# Patient Record
Sex: Male | Born: 1970 | Race: Black or African American | Hispanic: No | Marital: Single | Smoking: Never smoker
Health system: Southern US, Community
[De-identification: ages and names within clinical notes are randomized; demographics above are authoritative.]

## PROBLEM LIST (undated history)

## (undated) DIAGNOSIS — I10 Essential (primary) hypertension: Secondary | ICD-10-CM

## (undated) DIAGNOSIS — I509 Heart failure, unspecified: Secondary | ICD-10-CM

## (undated) DIAGNOSIS — I4892 Unspecified atrial flutter: Secondary | ICD-10-CM

## (undated) DIAGNOSIS — E669 Obesity, unspecified: Secondary | ICD-10-CM

## (undated) DIAGNOSIS — I351 Nonrheumatic aortic (valve) insufficiency: Secondary | ICD-10-CM

## (undated) HISTORY — DX: Obesity, unspecified: E66.9

## (undated) HISTORY — DX: Essential (primary) hypertension: I10

## (undated) HISTORY — DX: Unspecified atrial flutter: I48.92

## (undated) HISTORY — PX: NO PAST SURGERIES: SHX2092

## (undated) HISTORY — PX: OTHER SURGICAL HISTORY: SHX169

## (undated) HISTORY — DX: Nonrheumatic aortic (valve) insufficiency: I35.1

---

## 2012-08-14 ENCOUNTER — Encounter: Payer: Self-pay | Admitting: Cardiology

## 2012-08-14 DIAGNOSIS — I509 Heart failure, unspecified: Secondary | ICD-10-CM

## 2012-10-24 ENCOUNTER — Encounter: Payer: Self-pay | Admitting: Cardiology

## 2012-10-27 ENCOUNTER — Other Ambulatory Visit: Payer: Self-pay | Admitting: *Deleted

## 2012-10-27 ENCOUNTER — Encounter: Payer: Self-pay | Admitting: Cardiology

## 2012-10-27 ENCOUNTER — Ambulatory Visit (INDEPENDENT_AMBULATORY_CARE_PROVIDER_SITE_OTHER): Payer: BC Managed Care – HMO | Admitting: Cardiology

## 2012-10-27 ENCOUNTER — Encounter: Payer: Self-pay | Admitting: *Deleted

## 2012-10-27 VITALS — BP 154/89 | HR 91 | Ht 75.0 in | Wt 237.8 lb

## 2012-10-27 DIAGNOSIS — I7121 Aneurysm of the ascending aorta, without rupture: Secondary | ICD-10-CM

## 2012-10-27 DIAGNOSIS — I10 Essential (primary) hypertension: Secondary | ICD-10-CM

## 2012-10-27 DIAGNOSIS — Z136 Encounter for screening for cardiovascular disorders: Secondary | ICD-10-CM

## 2012-10-27 DIAGNOSIS — I359 Nonrheumatic aortic valve disorder, unspecified: Secondary | ICD-10-CM

## 2012-10-27 DIAGNOSIS — I712 Thoracic aortic aneurysm, without rupture, unspecified: Secondary | ICD-10-CM

## 2012-10-27 DIAGNOSIS — I4892 Unspecified atrial flutter: Secondary | ICD-10-CM

## 2012-10-27 DIAGNOSIS — I351 Nonrheumatic aortic (valve) insufficiency: Secondary | ICD-10-CM

## 2012-10-27 NOTE — Patient Instructions (Addendum)
Your physician recommends that you schedule a follow-up appointment in: 1 month. Your physician recommends that you continue on your current medications as directed. Please refer to the Current Medication list given to you today. Your physician has requested that you have an echocardiogram. Echocardiography is a painless test that uses sound waves to create images of your heart. It provides your doctor with information about the size and shape of your heart and how well your heart's chambers and valves are working. This procedure takes approximately one hour. There are no restrictions for this procedure. Non-Cardiac CT Angiography (CTA), is a special type of CT scan that uses a computer to produce multi-dimensional views of major blood vessels throughout the body. In CT angiography, a contrast material is injected through an IV to help visualize the blood vessels

## 2012-10-27 NOTE — Progress Notes (Signed)
HPI The patient presents for evaluation of lower extremity edema and an abnormal echocardiogram. He has no prior cardiac history. However, because of lower extremity swelling recently he was sent for an echocardiogram. There was no color Doppler with this study and it was ordered as a limited study. This demonstrated an EF of 50-55%. Left atrium was moderately dilated. The aortic valve was trileaflet. However, there was moderate dilatation of the aortic root. There was mild concentric left ventricular hypertrophy. The EF was low normal at 50-55%. There was some regional wall motion abnormality with the basal inferior wall being hypokinetic. 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  The patient reports that he's had swelling in his legs for several months. He has been close to 300 pounds in the past and lost a significant amount of weight through diet but then plateaued and started gaining. He started having lower extremity swelling and was started on diuretics and lost about 15 pounds. He's continued to lose weight that he thinks is out of proportion to any diet or fluid loss. He's had increasing dyspnea with exertion that is slightly better with diuretics. He does describe PND and orthopnea. He's not having any chest pressure, neck or arm discomfort. He's had no fevers or chills. He denies any prior cardiac workup.  No Known Allergies  Current Outpatient Prescriptions  Medication Sig Dispense Refill  . furosemide (LASIX) 20 MG tablet Take 1 tablet by mouth daily.      . GuaiFENesin (MUCUS RELIEF ADULT PO) Take 1 tablet by mouth daily.      Marland Kitchen losartan-hydrochlorothiazide (HYZAAR) 100-12.5 MG per tablet Take 1 tablet by mouth daily.      . potassium chloride SA (K-DUR,KLOR-CON) 20 MEQ tablet Take 1 tablet by mouth daily.       No current facility-administered medications for this visit.    Past Medical History  Diagnosis Date  . Hypertension     No past surgical history on  file.  Family History  Problem Relation Age of Onset  . Heart attack Father 20  . Diabetes Mother     History   Social History  . Marital Status: Single    Spouse Name: N/A    Number of Children: N/A  . Years of Education: N/A   Occupational History  . Not on file.   Social History Main Topics  . Smoking status: Never Smoker   . Smokeless tobacco: Not on file  . Alcohol Use: Yes     Comment: once a month  . Drug Use: Yes     Comment: Occassional marijuana  . Sexually Active: Not on file   Other Topics Concern  . Not on file   Social History Narrative  . No narrative on file    ROS:  Positive for weight loss. Otherwise as stated in the history of present illness and negative for all other systems.  PHYSICAL EXAM BP 154/89  Pulse 91  Ht 6\' 3"  (1.905 m)  Wt 237 lb 12.8 oz (107.865 kg)  BMI 29.72 kg/m2 GENERAL:  Well appearing HEENT:  Pupils equal round and reactive, fundi not visualized, oral mucosa unremarkable NECK:  No jugular venous distention, waveform within normal limits, carotid upstroke brisk and symmetric, no bruits, no thyromegaly LYMPHATICS:  No cervical, inguinal adenopathy LUNGS:  Clear to auscultation bilaterally BACK:  No CVA tenderness CHEST:  Unremarkable HEART:  PMI not displaced or sustained,S1 and S2 within normal limits, no S3, no S4, no clicks, no rubs, Brief systolic  murmur heard at the apex and radiating slightly out the aortic outflow tract, there is a 3/6 diastolic murmur ending no later than mid diastole heard best at the left fourth intercostal space ABD:  Flat, positive bowel sounds normal in frequency in pitch, no bruits, no rebound, no guarding, no midline pulsatile mass, no hepatomegaly, no splenomegaly EXT:  2 (bounding) plus pulses throughout, mild bilateral lower extremity edema, no cyanosis no clubbing SKIN:  No rashes no nodules NEURO:  Cranial nerves II through XII grossly intact, motor grossly intact throughout PSYCH:   Cognitively intact, oriented to person place and time   EKG:  Atrial flutter, 31 conduction, left axis deviation, left anterior fascicular block.  10/27/2012  ASSESSMENT AND PLAN  AI:  The patient has aortic insufficiency by physical exam. By echo there is evidence of aortic root dilatation. I suspect this is all significant. I will start with a complete echocardiogram. I will also order CT angiogram to look at the aortic root size. For now he will continue on the medicines as listed.  AF:  The patient has a new diagnosis of atrial flutter. He does not feel tachypalpitations. I will check the above studies and then discuss with him anticoagulation therapy if no further invasive evaluation is planned. Ultimately I might also consider a Holter to make sure that this is persistent with reasonable rate control. However, I do think he is in persistent flutter. I will check a TSH. I will check other routine labs.  HTN:   For now he will continue the meds as listed but he will keep a blood pressure diary. I will consider neck titration such as the addition of calcium channel blocker given his aortic insufficiency.  We will discuss further med titration in the future.

## 2012-10-28 DIAGNOSIS — I4892 Unspecified atrial flutter: Secondary | ICD-10-CM | POA: Insufficient documentation

## 2012-10-28 DIAGNOSIS — I351 Nonrheumatic aortic (valve) insufficiency: Secondary | ICD-10-CM | POA: Insufficient documentation

## 2012-11-03 ENCOUNTER — Inpatient Hospital Stay
Admission: RE | Admit: 2012-11-03 | Discharge: 2012-11-03 | Disposition: A | Discharge status: Home / Self Care | Payer: BC Managed Care – HMO | Source: Ambulatory Visit | Attending: Cardiology | Admitting: Cardiology

## 2012-11-03 ENCOUNTER — Ambulatory Visit (INDEPENDENT_AMBULATORY_CARE_PROVIDER_SITE_OTHER)
Admission: RE | Admit: 2012-11-03 | Discharge: 2012-11-03 | Disposition: A | Discharge status: Home / Self Care | Payer: BC Managed Care – HMO | Source: Ambulatory Visit | Attending: Cardiology | Admitting: Cardiology

## 2012-11-03 DIAGNOSIS — I7121 Aneurysm of the ascending aorta, without rupture: Secondary | ICD-10-CM

## 2012-11-03 DIAGNOSIS — I712 Thoracic aortic aneurysm, without rupture, unspecified: Secondary | ICD-10-CM

## 2012-11-03 DIAGNOSIS — Z136 Encounter for screening for cardiovascular disorders: Secondary | ICD-10-CM

## 2012-11-03 MED ORDER — IOHEXOL 350 MG/ML SOLN
100.0000 mL | Freq: Once | INTRAVENOUS | Status: AC | PRN
  Administered 2012-11-03: 100 mL via INTRAVENOUS

## 2012-11-04 ENCOUNTER — Telehealth: Payer: Self-pay | Admitting: Cardiology

## 2012-11-04 NOTE — Telephone Encounter (Signed)
Informed pt of normal results

## 2012-11-04 NOTE — Telephone Encounter (Signed)
Message copied by Burnice Logan on Tue Nov 04, 2012  3:46 PM ------      Message from: FLEMING, PAMELA J      Created: Mon Nov 03, 2012  2:47 PM                   ----- Message -----         From: Rollene Rotunda, MD         Sent: 11/03/2012   1:27 PM           To: Rocco Serene, RN            Labs OK.  Call Mr. Woodberry with the results and send results to Ardyth Man, MD       ------

## 2012-11-05 ENCOUNTER — Telehealth: Payer: Self-pay | Admitting: *Deleted

## 2012-11-05 ENCOUNTER — Other Ambulatory Visit: Payer: BC Managed Care – HMO

## 2012-11-05 DIAGNOSIS — I351 Nonrheumatic aortic (valve) insufficiency: Secondary | ICD-10-CM

## 2012-11-05 DIAGNOSIS — I4892 Unspecified atrial flutter: Secondary | ICD-10-CM

## 2012-11-05 DIAGNOSIS — I779 Disorder of arteries and arterioles, unspecified: Secondary | ICD-10-CM

## 2012-11-05 MED ORDER — METOPROLOL TARTRATE 25 MG PO TABS: 25.0000 mg | ORAL_TABLET | Freq: Four times a day (QID) | ORAL | Status: DC

## 2012-11-05 NOTE — Telephone Encounter (Signed)
Patient informed. Awaiting call back from [facility].

## 2012-11-05 NOTE — Telephone Encounter (Signed)
Called to check on status of appointment, per Arline Asp, they are working on this appointment now.

## 2012-11-05 NOTE — Telephone Encounter (Signed)
Message copied by Eustace Moore on Wed Nov 05, 2012  9:25 AM ------      Message from: Rollene Rotunda      Created: Wed Nov 05, 2012  8:18 AM       I spoke with Dr. Evelene Croon.  The patient will need surgery.  We will schedule him to see Dr. Laneta Simmers ASAP.  I will start a beta blocker metoprolol 25 mg qid for now and reschedule the echocardiogram and also order a CT coronary angiogram. ------

## 2012-11-06 ENCOUNTER — Encounter: Payer: Self-pay | Admitting: *Deleted

## 2012-11-12 ENCOUNTER — Ambulatory Visit (HOSPITAL_COMMUNITY): Admission: RE | Admit: 2012-11-12 | Payer: BC Managed Care – HMO | Source: Ambulatory Visit

## 2012-11-12 ENCOUNTER — Telehealth (HOSPITAL_COMMUNITY): Payer: Self-pay | Admitting: Radiology

## 2012-11-12 ENCOUNTER — Ambulatory Visit (HOSPITAL_COMMUNITY): Payer: BC Managed Care – HMO | Attending: Cardiology | Admitting: Radiology

## 2012-11-12 ENCOUNTER — Other Ambulatory Visit: Payer: Self-pay

## 2012-11-12 ENCOUNTER — Encounter: Payer: Self-pay | Admitting: Surgery

## 2012-11-12 ENCOUNTER — Institutional Professional Consult (permissible substitution) (INDEPENDENT_AMBULATORY_CARE_PROVIDER_SITE_OTHER): Payer: BC Managed Care – HMO | Admitting: Surgery

## 2012-11-12 VITALS — BP 146/80 | HR 88 | Resp 20 | Ht 75.0 in | Wt 237.0 lb

## 2012-11-12 DIAGNOSIS — I7100 Dissection of unspecified site of aorta: Secondary | ICD-10-CM

## 2012-11-12 DIAGNOSIS — I4892 Unspecified atrial flutter: Secondary | ICD-10-CM

## 2012-11-12 DIAGNOSIS — I712 Thoracic aortic aneurysm, without rupture, unspecified: Secondary | ICD-10-CM

## 2012-11-12 DIAGNOSIS — I7121 Aneurysm of the ascending aorta, without rupture: Secondary | ICD-10-CM

## 2012-11-12 DIAGNOSIS — I359 Nonrheumatic aortic valve disorder, unspecified: Secondary | ICD-10-CM

## 2012-11-12 DIAGNOSIS — Z136 Encounter for screening for cardiovascular disorders: Secondary | ICD-10-CM

## 2012-11-12 DIAGNOSIS — I351 Nonrheumatic aortic (valve) insufficiency: Secondary | ICD-10-CM

## 2012-11-12 DIAGNOSIS — I059 Rheumatic mitral valve disease, unspecified: Secondary | ICD-10-CM | POA: Insufficient documentation

## 2012-11-12 NOTE — Progress Notes (Signed)
Echocardiogram performed.  

## 2012-11-12 NOTE — Telephone Encounter (Signed)
Spoke with Dr. Shirlee Latch.  He stated to call Dr. Laneta Simmers inform him are correlating CT.

## 2012-11-12 NOTE — Telephone Encounter (Signed)
Spoke with Katina Dung RN for Dr. Shirlee Latch, reader of the day, to take a look at the echocardiogram.  I spoke with Dennis Bast RN for Dr. Ladona Ridgel, DOD. Noted in the chart the patient seeing Dr. Laneta Simmers today 930 am and having a CTA, today.  Was told to just to sent patient to Dr. Marton Redwood.  I gave to the office it doesn't 9 am.  Answering service call Dr. Tyrone Sage, Dr on call.  He gave me the pager number for Dr. Laneta Simmers but office will open in a few minutes. I will call back.

## 2012-11-13 ENCOUNTER — Encounter: Payer: Self-pay | Admitting: Surgery

## 2012-11-13 DIAGNOSIS — I7100 Dissection of unspecified site of aorta: Secondary | ICD-10-CM | POA: Insufficient documentation

## 2012-11-13 NOTE — Progress Notes (Signed)
[address]       [place] [address]             [phone]        PCP is Ardyth Man, MD Referring Provider is Rollene Rotunda, MD  Chief Complaint  Patient presents with  . Aortic Insuffiency    surgical eval, ECHO 11/12/12, CTA Chest 11/03/12     HPI:  The patient is a 42 year old gentleman who recently presented with new onset of lower extremity swelling and increasing dyspnea with exertion. He was noted to have a diastolic murmur and an echocardiogram showed an EF of 50-55% with mild LVH and dilation of the aortic root. He was started on diuretics and lost about 15 lbs of fluid with improvement of his symptoms. A complete echo on 11/12/2012 showed moderate to severe AI with a severely dilated aortic root with a dissection plane seen above the right coronary cusp. There was moderate MR. CTA shows fusiform aneurysmal dilatation of the aortic root with a diameter of 6.3 x 7.3 cm. There is an irregular outpouching with septation along the proximal ascending aortic wall laterally that appears to be a chronic dissection. There is also diffuse mediastinal adenopathy concerning for a lymphoproliferative process.  Past Medical History  Diagnosis Date  . Hypertension   . Obesity   . Atrial flutter   . Aortic insufficiency     Past Surgical History  Procedure Laterality Date  . None      Family History  Problem Relation Age of Onset  . Heart attack Father 23  . Diabetes Mother   . Cancer Mother   . Hypertension Father   . Cancer Sister   no family hx of connective tissue disorder.   Social History History  Substance Use Topics  . Smoking status: Never Smoker   . Smokeless tobacco: Not on file  . Alcohol Use: Yes     Comment: once a month    Current Outpatient Prescriptions  Medication Sig Dispense Refill  . furosemide (LASIX) 20 MG tablet Take 1 tablet by mouth daily.      . GuaiFENesin (MUCUS RELIEF ADULT PO) Take 1 tablet by mouth daily.       Marland Kitchen losartan-hydrochlorothiazide (HYZAAR) 100-12.5 MG per tablet Take 1 tablet by mouth daily.      . metoprolol tartrate (LOPRESSOR) 25 MG tablet Take 1 tablet (25 mg total) by mouth 4 (four) times daily.  180 tablet  3  . potassium chloride SA (K-DUR,KLOR-CON) 20 MEQ tablet Take 1 tablet by mouth daily.       No current facility-administered medications for this visit.    No Known Allergies  Review of Systems  Constitutional: Positive for fatigue and unexpected weight change. Negative for fever, chills, diaphoresis, activity change and appetite change.  HENT: Negative.   Eyes: Negative.   Respiratory: Positive for cough and shortness of breath. Negative for chest tightness.        Orthopnea  Cardiovascular: Positive for leg swelling. Negative for chest pain and palpitations.  Gastrointestinal: Negative.   Endocrine: Negative.   Genitourinary: Negative.   Musculoskeletal: Negative.   Allergic/Immunologic: Negative.   Neurological: Negative.   Hematological: Negative.   Psychiatric/Behavioral: Negative.     BP 146/80  Pulse 88  Resp 20  Ht 6\' 3"  (1.905 m)  Wt 237 lb (107.502 kg)  BMI 29.62 kg/m2  SpO2 93% Physical Exam  Constitutional: He is oriented to person, place,  and time. He appears well-nourished. No distress.  HENT:  Head: Normocephalic and atraumatic.  Mouth/Throat: Oropharynx is clear and moist.  Eyes: EOM are normal. Pupils are equal, round, and reactive to light.  Neck: Normal range of motion. Neck supple. No JVD present. No thyromegaly present.  Cardiovascular: Normal rate and regular rhythm.   Murmur heard. 3/6 diastolic murmur over the aorta.  Pulmonary/Chest: Effort normal and breath sounds normal. No respiratory distress. He has no rales.  Abdominal: Soft. He exhibits no distension and no mass. There is no tenderness.  Musculoskeletal: Normal range of motion. He exhibits edema.  mild  Lymphadenopathy:    He has no cervical adenopathy.  Neurological:  He is alert and oriented to person, place, and time. No cranial nerve deficit or sensory deficit.  Skin: Skin is warm and dry.  Psychiatric: He has a normal mood and affect.     Diagnostic Tests:  [facility]                    [address]                     [place], [place] [address]                         [phone]   ------------------------------------------------------------ Transthoracic Echocardiography  Patient:    Thorin, Starner MR #:       [id number] Study Date: 11/12/2012 Gender:     M Age:        41 Height:     190.5cm Weight:     107.5kg BSA:        2.32m^2 Pt. Status: Room:    ORDERING     Hochrein, Fayrene Fearing  REFERRING    Hochrein, Fayrene Fearing  ATTENDING    Shirlee Latch, Dalton  PERFORMING   [facility]  SONOGRAPHER  Junious Dresser, RDCS cc:  ------------------------------------------------------------ LV EF: 50% -   55%  ------------------------------------------------------------ Indications:     Ascending aortic aneurysm 441.2  ------------------------------------------------------------ History:   PMH:  Abnormal CT, Aortic root 6.3 cm x, irregular outpouching with septation. Questionable suspicion for a focal chronic dissection or irregular septated aneurysmal outpouching of sinus of Valsalva. Ascending aortic aneurysm. Abnormal echo. Acquired from the patient and from the patient's chart.  Dyspnea and bilateral lower extremity edema.  Atrial flutter.  Aortic regurgitation. Risk factors:  Hypertension.  ------------------------------------------------------------ Study Conclusions  - Left ventricle: Inferobasal and distal septal hypokinesis   The cavity size was normal. Wall thickness was increased   in a pattern of mild LVH. Systolic function was normal.   The estimated ejection fraction was in the range of 50% to   55%. - Aortic valve: Moderate to severe regurgitation. - Aorta: Ascending aorta is severely dilated with disection    plane seen above right coronary cusp - Mitral valve: Moderate regurgitation. - Left atrium: The atrium was mildly dilated. - Atrial septum: No defect or patent foramen ovale was   identified. - Pulmonary arteries: PA peak pressure: 57mm Hg (S). Transthoracic echocardiography.  M-mode, complete 2D, spectral Doppler, and color Doppler.  Height:  Height: 190.5cm. Height: 75in.  Weight:  Weight: 107.5kg. Weight: 236.5lb.  Body mass index:  BMI: 29.6kg/m^2.  Body surface area:    BSA: 2.66m^2.  Blood pressure:     154/89.  Patient status:  Outpatient.  Location:  [facility]  ------------------------------------------------------------  ------------------------------------------------------------ Left ventricle:  Inferobasal and distal septal hypokinesis The cavity size was normal. Wall thickness was increased in a pattern of mild LVH. Systolic function was normal. The estimated ejection fraction was in the range of 50% to 55%.   ------------------------------------------------------------ Aortic valve:   Doppler:   Moderate to severe regurgitation.    VTI ratio of LVOT to aortic valve: 0.66. Valve area: 4.07cm^2(VTI). Indexed valve area: 1.72cm^2/m^2 (VTI). Peak velocity ratio of LVOT to aortic valve: 0.82. Valve area: 5.03cm^2 (Vmax). Indexed valve area: 2.13cm^2/m^2 (Vmax). Mean gradient: 6mm Hg (S).  ------------------------------------------------------------ Aorta:  Ascending aorta is severely dilated with disection plane seen above right coronary cusp  ------------------------------------------------------------ Mitral valve:   Doppler:   Moderate regurgitation.  ------------------------------------------------------------ Left atrium:  The atrium was mildly dilated.  ------------------------------------------------------------ Atrial septum:  No defect or patent foramen ovale was identified.  ------------------------------------------------------------ Right  ventricle:  The cavity size was normal. Wall thickness was normal. Systolic function was normal.  ------------------------------------------------------------ Pulmonic valve:    Doppler:   Mild regurgitation.  ------------------------------------------------------------ Tricuspid valve:   Doppler:   Mild regurgitation.  ------------------------------------------------------------ Right atrium:  The atrium was normal in size.  ------------------------------------------------------------ Pericardium:  The pericardium was normal in appearance.   ------------------------------------------------------------ Post procedure conclusions Ascending Aorta:  - Ascending aorta is severely dilated with disection plane   seen above right coronary cusp  ------------------------------------------------------------  2D measurements        Normal  Doppler measurements    Norma Left ventricle                                         l LVID ED,     59 mm     43-52   Main pulmonary artery chord,                         Pressure   57 mm Hg     =30 PLAX                           , S LVID ES,   42.8 mm     23-38   Left ventricle chord,                         Ea, lat  13.2 cm/s      ----- PLAX                           ann, FS, chord,   27 %      >29     tiss DP PLAX                           Ea, med   4.5 cm/s      ----- LVPW, ED   10.2 mm     ------  ann, IVS/LVPW   1.45        <1.3    tiss DP ratio, ED                      LVOT Vol ED,     189 ml     ------  Peak      120 cm/s      ----- MOD1  vel, S Vol ES,     104 ml     ------  VTI, S   18.9 cm        ----- MOD1                           Peak        6 mm Hg     ----- EF, MOD1     45 %      ------  gradient Vol index,   80 ml/m^2 ------  , S ED, MOD1                       HR         89 bpm       ----- Vol index,   44 ml/m^2 ------  Stroke   116. ml        ----- ES, MOD1                       vol         4 Vol ED,      196 ml     ------  Cardiac  10.4 L/min     ----- MOD2                           output Vol ES,     105 ml     ------  Cardiac   4.4 L/(min-m^ ----- MOD2                           index         2) EF, MOD2     46 %      ------  Stroke   49.3 ml/m^2    ----- Stroke       91 ml     ------  index vol, MOD2                      Aortic valve Vol index,   83 ml/m^2 ------  Peak      147 cm/s      ----- ED, MOD2                       vel, S Vol index,   44 ml/m^2 ------  Mean      113 cm/s      ----- ES, MOD2                       vel, S Stroke     38.6 ml/m^2 ------  VTI, S   28.6 cm        ----- index,                         Mean        6 mm Hg     ----- MOD2                           gradient Ventricular septum             , S IVS, ED    14.8 mm     ------  VTI      0.66           -----  LVOT                           ratio Diam, S      28 mm     ------  LVOT/AV Area       6.16 cm^2   ------  Area,    4.07 cm^2      ----- Diam         28 mm     ------  VTI Aorta                          Area     1.72 cm^2/m^2  ----- Root diam,   41 mm     ------  index ED                             (VTI) AAo AP       65 mm     ------  Peak vel 0.82           ----- diam, S                        ratio, Left atrium                    LVOT/AV AP dim       52 mm     ------  Area,    5.03 cm^2      ----- AP dim      2.2 cm/m^2 <2.2    Vmax index                          Area     2.13 cm^2/m^2  -----                                index                                (Vmax)                                Regurg    296 ms        -----                                PHT                                Mitral valve                                Max       530 cm/s      -----                                regurg  vel                                Regurg    156 cm        -----                                VTI                                Tricuspid valve                                 Regurg    359 cm/s      -----                                peak vel                                Peak       52 mm Hg     -----                                RV-RA                                gradient                                , S                                Systemic veins                                Estimate    5 mm Hg     -----                                d CVP                                Right ventricle                                Pressure   57 mm Hg     <30                                , S                                Sa vel,  10.7 cm/s      -----  lat ann,                                tiss DP   ------------------------------------------------------------ Prepared and Electronically Authenticated by  Charlton Haws 2014-07-16T09:[id number]     *RADIOLOGY REPORT*   Clinical Data: Ascending aortic aneurysm, aortic insufficiency, atrial flutter   CT ANGIOGRAPHY CHEST   Technique:  Multidetector CT imaging of the chest using the standard protocol during bolus administration of intravenous contrast. Multiplanar reconstructed images including MIPs were obtained and reviewed to evaluate the vascular anatomy.   Contrast: OMNIPAQUE IOHEXOL 350 MG/ML SOLN   Comparison: None.   Findings: There is fusiform aneurysmal dilatation noted of the proximal ascending aortic root into the sinus of Valsalva.  Aortic root diameter measures 6.3 cm AP and 7.3 cm transverse, image 54. Along the proximal ascending aortic wall laterally, there is an irregular outpouching with septation, images 39-48.  This is suspicious for a focal chronic dissection or irregular septated aneurysmal outpouching related to the dilated sinus of Valsalva. No intramural hemorrhage or mediastinal hemorrhage appreciated.  No pericardial effusion.  Heart is enlarged diffusely.  Bovine arch anatomy is noted, a normal variant.  Major branch  vessels remain patent.  Descending thoracic aorta is normal in caliber.   Diffuse mediastinal adenopathy noted.  Superior right paratracheal lymph node measures 3.3 x 2.7 cm, image 21.  Additional enlarged prevascular, AP window, and precarinal lymph nodes are noted. These lymph nodes are suspicious for a lymphoproliferative process.   Lung windows demonstrate diffuse patchy ground-glass opacities throughout the lungs with interlobular septal thickening bilaterally suspicious for chronic interstitial edema.  There is also a trace right pleural effusion.  Suspect mild congestive heart failure.   Included upper abdomen demonstrates an incidental hypodense probable cyst in the right hepatic lobe image 116 measuring 10 mm. Thickening of the left adrenal gland is noted, nonspecific.  No acute finding in the upper abdomen demonstrated.   Mild diffuse thoracic degenerative change with prominent lower thoracic Schmorl's nodes.  No compression fracture.   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   Irregular septated outpouching of the proximal ascending thoracic aorta along the right lateral wall which could represent a chronic focal dissection versus chronic irregular distention and dilatation of the sinus of Valsalva.   No acute intramural hemorrhage, mediastinal hemorrhage, or pericardial effusion.   Diffuse mediastinal adenopathy, concerning for a lymphoproliferative process such as lymphoma.   Cardiomegaly with diffuse interstitial edema and small right effusion compatible with mild CHF     Original Report Authenticated By: Judie Petit. Miles Costain, M.D.     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 There is moderate MR. I agree that surgical repair is indicated due to the high risk of further dissection or rupture as well as  progressive congestive heart failure due to severe AI. He will need to have his coronaries evaluated. He has a strong family history of heart disease. His MR can be evaluated in the OR with TEE.I think cath would be difficult with his large root aneurysm. Cardiac CT would probably give [place] the information we need. He does appear to have some calcified plaque in the proximal LAD on his chest CTA. At 42 years old I have recommended a mechanical valve if his valve  is not suitable for repair. I discussed the need for lifelong anticoagulation with coumadin. I discussed the operative procedure with the patient and family including alternatives, benefits and risks; including but not limited to bleeding, blood transfusion, infection, stroke, myocardial infarction, graft failure, heart block requiring a permanent pacemaker, organ dysfunction, and death.  Eden Lathe understands and would like to think about this further before scheduling surgery.    Plan:  He said that he would call us when he decides to proceed with surgery. I discussed the importance of proceeding with surgery soon since this is a large aneurysm. He understands the ongoing risk of dissection, rupture and death if this is not repaired promptly. He will need a cardiac CT done to evaluate his coronary arteries.

## 2012-11-14 ENCOUNTER — Ambulatory Visit (HOSPITAL_COMMUNITY)
Admission: RE | Admit: 2012-11-14 | Discharge: 2012-11-14 | Disposition: A | Discharge status: Home / Self Care | Payer: BC Managed Care – HMO | Source: Ambulatory Visit | Attending: Cardiology | Admitting: Cardiology

## 2012-11-14 ENCOUNTER — Encounter (HOSPITAL_COMMUNITY): Payer: Self-pay

## 2012-11-14 ENCOUNTER — Telehealth: Payer: Self-pay | Admitting: *Deleted

## 2012-11-14 DIAGNOSIS — I779 Disorder of arteries and arterioles, unspecified: Secondary | ICD-10-CM

## 2012-11-14 DIAGNOSIS — I359 Nonrheumatic aortic valve disorder, unspecified: Secondary | ICD-10-CM | POA: Insufficient documentation

## 2012-11-14 DIAGNOSIS — R599 Enlarged lymph nodes, unspecified: Secondary | ICD-10-CM | POA: Insufficient documentation

## 2012-11-14 DIAGNOSIS — J8410 Pulmonary fibrosis, unspecified: Secondary | ICD-10-CM | POA: Insufficient documentation

## 2012-11-14 DIAGNOSIS — I4892 Unspecified atrial flutter: Secondary | ICD-10-CM | POA: Insufficient documentation

## 2012-11-14 DIAGNOSIS — Z01818 Encounter for other preprocedural examination: Secondary | ICD-10-CM | POA: Insufficient documentation

## 2012-11-14 DIAGNOSIS — I351 Nonrheumatic aortic (valve) insufficiency: Secondary | ICD-10-CM

## 2012-11-14 HISTORY — DX: Heart failure, unspecified: I50.9

## 2012-11-14 MED ORDER — METOPROLOL TARTRATE 1 MG/ML IV SOLN
INTRAVENOUS | Status: AC
  Administered 2012-11-14: 5 mg
  Filled 2012-11-14: qty 5

## 2012-11-14 MED ORDER — NITROGLYCERIN 0.4 MG SL SUBL
SUBLINGUAL_TABLET | SUBLINGUAL | Status: AC
  Administered 2012-11-14: 16:00:00
  Filled 2012-11-14: qty 25

## 2012-11-14 MED ORDER — METOPROLOL TARTRATE 1 MG/ML IV SOLN
5.0000 mg | INTRAVENOUS | Status: DC | PRN
  Administered 2012-11-14: 5 mg via INTRAVENOUS
  Filled 2012-11-14: qty 5

## 2012-11-14 MED ORDER — METOPROLOL TARTRATE 1 MG/ML IV SOLN
INTRAVENOUS | Status: AC
  Administered 2012-11-14: 5 mg
  Filled 2012-11-14: qty 15

## 2012-11-14 MED ORDER — NITROGLYCERIN 0.4 MG SL SUBL
0.4000 mg | SUBLINGUAL_TABLET | SUBLINGUAL | Status: DC | PRN
  Filled 2012-11-14: qty 25

## 2012-11-14 MED ORDER — IOHEXOL 350 MG/ML SOLN
80.0000 mL | Freq: Once | INTRAVENOUS | Status: AC | PRN
  Administered 2012-11-14: 80 mL via INTRAVENOUS

## 2012-11-14 NOTE — Telephone Encounter (Signed)
Patient informed and copy sent to PCP. 

## 2012-11-14 NOTE — Telephone Encounter (Signed)
Message copied by Eustace Moore on Fri Nov 14, 2012 10:46 AM ------      Message from: Rollene Rotunda      Created: Thu Nov 13, 2012 10:40 PM       The patient is aware of the results and the need for surgery.  He has been seen in consultation by Dr. Laneta Simmers.  Please refer to his note for details. Call Mr. Zwahlen with the results and send results to Ardyth Man, MD       ------

## 2012-11-14 NOTE — Telephone Encounter (Signed)
Message copied by Eustace Moore on Fri Nov 14, 2012 10:04 AM ------      Message from: Sean Juarez      Created: Thu Nov 13, 2012 10:40 PM       The patient is aware of the results and the need for surgery.  He has been seen in consultation by Dr. Laneta Simmers.  Please refer to his note for details. Call Mr. Dolata with the results and send results to Ardyth Man, MD       ------

## 2012-11-21 ENCOUNTER — Telehealth: Payer: Self-pay | Admitting: *Deleted

## 2012-11-21 NOTE — Telephone Encounter (Signed)
Message copied by Eustace Moore on Fri Nov 21, 2012 10:51 AM ------      Message from: Meredeth Ide, PAMELA J      Created: Tue Nov 18, 2012  1:23 PM                   ----- Message -----         From: Rollene Rotunda, MD         Sent: 11/15/2012   7:55 PM           To: Rocco Serene, RN            Nonobstructive CAD.  Call Mr. Cousins with the results and send results to Ardyth Man, MD       ------

## 2012-11-21 NOTE — Telephone Encounter (Signed)
Patient informed and copy sent to PCP. 

## 2012-11-25 ENCOUNTER — Other Ambulatory Visit: Payer: Self-pay

## 2012-11-25 DIAGNOSIS — I712 Thoracic aortic aneurysm, without rupture, unspecified: Secondary | ICD-10-CM

## 2012-11-25 DIAGNOSIS — I359 Nonrheumatic aortic valve disorder, unspecified: Secondary | ICD-10-CM

## 2012-12-01 ENCOUNTER — Encounter (HOSPITAL_COMMUNITY): Payer: Self-pay

## 2012-12-03 ENCOUNTER — Encounter: Payer: Self-pay | Admitting: Surgery

## 2012-12-03 ENCOUNTER — Ambulatory Visit (INDEPENDENT_AMBULATORY_CARE_PROVIDER_SITE_OTHER): Payer: BC Managed Care – HMO | Admitting: Surgery

## 2012-12-03 VITALS — BP 136/86 | HR 90 | Resp 20 | Ht 75.0 in | Wt 237.0 lb

## 2012-12-03 DIAGNOSIS — I359 Nonrheumatic aortic valve disorder, unspecified: Secondary | ICD-10-CM

## 2012-12-03 DIAGNOSIS — I712 Thoracic aortic aneurysm, without rupture, unspecified: Secondary | ICD-10-CM

## 2012-12-03 DIAGNOSIS — I351 Nonrheumatic aortic (valve) insufficiency: Secondary | ICD-10-CM

## 2012-12-03 DIAGNOSIS — I7121 Aneurysm of the ascending aorta, without rupture: Secondary | ICD-10-CM

## 2012-12-04 ENCOUNTER — Encounter: Payer: Self-pay | Admitting: Surgery

## 2012-12-04 NOTE — Progress Notes (Signed)
[address]       [place] [address]             [phone]        HPI:  The patient is a 42 year old gentleman who recently presented with new onset of lower extremity swelling and increasing dyspnea with exertion. He was noted to have a diastolic murmur and an echocardiogram showed an EF of 50-55% with mild LVH and dilation of the aortic root. He was started on diuretics and lost about 15 lbs of fluid with improvement of his symptoms. A complete echo on 11/12/2012 showed moderate to severe AI with a severely dilated aortic root with a dissection plane seen above the right coronary cusp. There was moderate MR. CTA shows fusiform aneurysmal dilatation of the aortic root with a diameter of 6.3 x 7.3 cm. There is an irregular outpouching with septation along the proximal ascending aortic wall laterally that appears to be a chronic dissection. There is also diffuse mediastinal adenopathy concerning for a lymphoproliferative process. He underwent a cardiac CT  On 11/17/2012 which showed no significant coronary disease.      Current Outpatient Prescriptions  Medication Sig Dispense Refill  . furosemide (LASIX) 20 MG tablet Take 1 tablet by mouth 2 (two) times daily.       Marland Kitchen guaiFENesin (MUCINEX) 600 MG 12 hr tablet Take 1,200 mg by mouth 2 (two) times daily as needed for congestion.      Marland Kitchen losartan-hydrochlorothiazide (HYZAAR) 100-12.5 MG per tablet Take 1 tablet by mouth daily.      . metoprolol tartrate (LOPRESSOR) 25 MG tablet Take 25 mg by mouth 2 (two) times daily.      . potassium chloride SA (K-DUR,KLOR-CON) 20 MEQ tablet Take 1 tablet by mouth daily.       No current facility-administered medications for this visit.     Physical Exam: BP 136/86  Pulse 90  Resp 20  Ht 6\' 3"  (1.905 m)  Wt 237 lb (107.502 kg)  BMI 29.62 kg/m2  SpO2 96% He looks well Cardiac exam shows regular rate and regular rhythm.  3/6 diastolic murmur over the aorta.  Pulmonary/Chest:  Effort normal and breath sounds normal. No respiratory distress. He has no rales. There is moderate bilateral lower extremity edema to the knees.  Diagnostic Tests:  Cyndie Chime, MD Mon Nov 17, 2012 2:09:24 PM EDT       **ADDENDUM** CREATED: 11/17/2012 14:03:22  OVER-READ INTERPRETATION - CT CHEST  The following report is an over-read performed by radiologist Dr.  Aubery Lapping. Kearney Hard, M.D. of [facility], [place] on 11/17/2012  14:03:22. This over-read does not include interpretation of  cardiac or coronary anatomy or pathology. The CTA interpretation  by the cardiologist is attached.  Comparison: Chest 11/03/2012  Findings: Patchy peripheral ground-glass densities are noted within  the lungs which are stable since prior study. This could represent  areas of hypoaeration/atelectasis or mild alveolitis.  Stable ascending thoracic aortic aneurysm and probable focal  dissection proximally. Stable mediastinal adenopathy and mild  cardiomegaly. No acute bony abnormality. Small right pleural  effusions.  IMPRESSION:  Findings are similar to prior study except for slight enlargement  of a small right pleural effusion. Continued cardiomegaly.  Ascending aortic aneurysm and dissection are stable. Stable  mediastinal adenopathy.  **END ADDENDUM** SIGNED BY: Aubery Lapping. Kearney Hard, M.D.      Study Result    Cardiac CT:  Indication: Aortic Disection Surgical planning  and question need  for bypass grafting  Protocol: Unfortunately the patient was in rapid atrial flutter at  presentation. He also had difficulty laying flat. He was given 15  mg of i.v. lopressor and sublingual nitro. Average flutter rate  during the scan was 70 bpm. He was scanned on a Philips 256  scanner. Given his atrial flutter we chose to scan him  retrospectively at 120 kV The 3D data set was analyzed on a Philips  work station using MIP, VRT and MPR modes  Findings:  Non-cardiac: Atelectasis and diffuse interstitial lung  disease  especially at bases Mediastinal adenopathy  Calcium Score: 102 with one foci of calcification in the proximal  LAD  Coronary Arteries: Right dominant with no anomalies.  LM- normal  LAD- Less than 30% calcified disease proximally. Normal mid and  distal vessel not well seen  D: normal  Circumflex: normal with single OM that comes off LM like an  intermediate branch with small AV groove branch  RCA- large dominant vessel that is normal  Aorta: There is a focal aortic dissection that appears to start  above the RCA and extends along the lateral aortic wall. The arch,  great vessels and descending thoracic aorta are not involvled  Diameter of combined false and true lumen 6.3 cm  Arch diameter 3.3 cm  Descneding Thoracic Aorta 2.7 cm  Impression:  Suboptimal study due to atrial flutter, filling of the false lumen  and aortic insufficiency that made filling the coronary arteries  difficult  1) Calcium scar 102 single area of calcification in proximal LAD  2) Right dominant arteries with no evidence of high grade  disease that would need bypass. Although the study is not ideal I  think it is diagnostic enough to avoid the risk of invasive cath  3) Focal ascending aortic dissection with brisk filling of the  false lumen and combined diameter of 6.3 cm No involvement of  aortic arch  4) Mediastinal adenopathy see separate report from [facility]  Radiology  Charlton Haws MD [facility]  Original Report Authenticated By: Charlton Haws, M.D.      Impression:  He will require a Bentall procedure using a mechanical valved graft. I discussed the pros and cons of mechanical and tissue valves with the patient and his family. I have recommended a mechanical valve given his young age. He understands the need for lifelong coumadin and the risks of anticoagulant related bleeding and thromboembolism which can be as high as 1-2 % per year each. He will also require biopsy of the mediastinal  adenopathy seen on CT. The etiology of this is unclear but it could be a lymphoproliferative disorder. I don't think there is any way to biopsy this adenopathy without putting him under anesthesia and I think we should fix his aneurysm and AI and then worry about the adenopathy if it requires treatment. He also has some MR by echo that appeared moderate on his last echo and this will require evaluation with TEE in the OR.  I discussed the operative procedure with the patient and family including alternatives, benefits and risks; including but not limited to bleeding, blood transfusion, infection, stroke, myocardial infarction, graft failure, heart block requiring a permanent pacemaker, organ dysfunction, and death.  Eden Lathe understands and agrees to proceed.  We will schedule surgery for 12/15/2012  Plan:  Bentall procedure with a mechanical valve graft, possible mitral valve repair, and biopsy of the mediastinal adenopathy on 12/15/2012.

## 2012-12-05 ENCOUNTER — Encounter: Payer: BC Managed Care – HMO | Admitting: Cardiovascular Disease

## 2012-12-05 ENCOUNTER — Encounter: Payer: Self-pay | Admitting: Cardiovascular Disease

## 2012-12-05 ENCOUNTER — Telehealth: Payer: Self-pay | Admitting: Cardiovascular Disease

## 2012-12-05 NOTE — Telephone Encounter (Signed)
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

## 2012-12-08 NOTE — Telephone Encounter (Signed)
I first saw him on June 30th and it would be reasonable to suggest this as the date to stop work.

## 2012-12-09 ENCOUNTER — Telehealth: Payer: Self-pay | Admitting: Cardiology

## 2012-12-09 NOTE — Telephone Encounter (Signed)
Received denial letter dated 11/13/12 for Cardiac CTA done on 11/14/12 @ [facility]. Was originally denied by [employer] due to being considered investigational. Dr. Antoine Poche spoke with [facility] medical director and got approval # [id number] which was valid 11/12/12 -11/26/12. I called [employer] and they state they have paid $785.00 of the allowed amount of total charges of $1,213.90 and payment was made on 12/03/12.  They had record of the authorization and state the denial letter was probably sent out before they had the authorization on file from their medical director. I called [facility] with this information and she Edwyna Perfect) stated that they did not have payment on file yet and that it would probably be posted in the next few days.   I will check on this account this week and make sure it is taken care of.

## 2012-12-09 NOTE — Progress Notes (Signed)
Patient ID: Sean Juarez, male   DOB: Sep 20, 1970, 42 y.o.   MRN: [id number] Office visit cancelled.

## 2012-12-10 NOTE — Telephone Encounter (Signed)
Patient informed. 

## 2012-12-11 ENCOUNTER — Encounter (HOSPITAL_COMMUNITY)
Admission: RE | Admit: 2012-12-11 | Discharge: 2012-12-11 | Disposition: A | Discharge status: Home / Self Care | Payer: BC Managed Care – PPO | Source: Ambulatory Visit | Attending: Surgery | Admitting: Surgery

## 2012-12-11 ENCOUNTER — Telehealth: Payer: Self-pay

## 2012-12-11 ENCOUNTER — Encounter (HOSPITAL_COMMUNITY): Payer: Self-pay

## 2012-12-11 ENCOUNTER — Ambulatory Visit (HOSPITAL_COMMUNITY)
Admission: RE | Admit: 2012-12-11 | Discharge: 2012-12-11 | Disposition: A | Discharge status: Home / Self Care | Payer: BC Managed Care – PPO | Source: Ambulatory Visit | Attending: Surgery | Admitting: Surgery

## 2012-12-11 VITALS — BP 130/82 | HR 81 | Temp 98.0°F | Resp 20 | Ht 75.0 in | Wt 235.0 lb

## 2012-12-11 DIAGNOSIS — I712 Thoracic aortic aneurysm, without rupture, unspecified: Secondary | ICD-10-CM

## 2012-12-11 DIAGNOSIS — Z01812 Encounter for preprocedural laboratory examination: Secondary | ICD-10-CM | POA: Insufficient documentation

## 2012-12-11 DIAGNOSIS — Z01810 Encounter for preprocedural cardiovascular examination: Secondary | ICD-10-CM | POA: Insufficient documentation

## 2012-12-11 DIAGNOSIS — R9431 Abnormal electrocardiogram [ECG] [EKG]: Secondary | ICD-10-CM | POA: Insufficient documentation

## 2012-12-11 DIAGNOSIS — I059 Rheumatic mitral valve disease, unspecified: Secondary | ICD-10-CM | POA: Insufficient documentation

## 2012-12-11 DIAGNOSIS — I359 Nonrheumatic aortic valve disorder, unspecified: Secondary | ICD-10-CM

## 2012-12-11 DIAGNOSIS — I517 Cardiomegaly: Secondary | ICD-10-CM | POA: Insufficient documentation

## 2012-12-11 DIAGNOSIS — B958 Unspecified staphylococcus as the cause of diseases classified elsewhere: Secondary | ICD-10-CM

## 2012-12-11 DIAGNOSIS — I4460 Unspecified fascicular block: Secondary | ICD-10-CM | POA: Insufficient documentation

## 2012-12-11 DIAGNOSIS — I4902 Ventricular flutter: Secondary | ICD-10-CM | POA: Insufficient documentation

## 2012-12-11 DIAGNOSIS — Z01811 Encounter for preprocedural respiratory examination: Secondary | ICD-10-CM | POA: Insufficient documentation

## 2012-12-11 LAB — PULMONARY FUNCTION TEST

## 2012-12-11 LAB — URINALYSIS, ROUTINE W REFLEX MICROSCOPIC
Glucose, UA: NEGATIVE mg/dL
Leukocytes, UA: NEGATIVE
Nitrite: NEGATIVE
pH: 7 (ref 5.0–8.0)

## 2012-12-11 LAB — COMPREHENSIVE METABOLIC PANEL
Alkaline Phosphatase: 84 U/L (ref 39–117)
BUN: 12 mg/dL (ref 6–23)
CO2: 22 mEq/L (ref 19–32)
Chloride: 102 mEq/L (ref 96–112)
GFR calc Af Amer: >90 mL/min (ref >90)
GFR calc non Af Amer: >90 mL/min (ref >90)
Glucose, Bld: 93 mg/dL (ref 70–99)
Potassium: 3.8 mEq/L (ref 3.5–5.1)
Total Bilirubin: 1.4 mg/dL — ABNORMAL HIGH (ref 0.3–1.2)

## 2012-12-11 LAB — ABO/RH: ABO/RH(D): O POS

## 2012-12-11 LAB — SURGICAL PCR SCREEN: MRSA, PCR: NEGATIVE

## 2012-12-11 LAB — BLOOD GAS, ARTERIAL
Bicarbonate: 28.4 mEq/L — ABNORMAL HIGH (ref 20.0–24.0)
Patient temperature: 98.6
TCO2: 29.8 mmol/L (ref 0–100)
pH, Arterial: 7.416 (ref 7.350–7.450)

## 2012-12-11 LAB — CBC
HCT: 35.9 % — ABNORMAL LOW (ref 39.0–52.0)
Hemoglobin: 11.5 g/dL — ABNORMAL LOW (ref 13.0–17.0)
MCHC: 32 g/dL (ref 30.0–36.0)
WBC: 7.4 10*3/uL (ref 4.0–10.5)

## 2012-12-11 LAB — HEMOGLOBIN A1C: Hgb A1c MFr Bld: 5.5 % (ref <5.7)

## 2012-12-11 MED ORDER — MUPIROCIN 2 % EX OINT: TOPICAL_OINTMENT | Freq: Two times a day (BID) | CUTANEOUS | Status: DC

## 2012-12-11 MED ORDER — ALBUTEROL SULFATE (5 MG/ML) 0.5% IN NEBU
2.5000 mg | INHALATION_SOLUTION | Freq: Once | RESPIRATORY_TRACT | Status: AC
  Administered 2012-12-11: 2.5 mg via RESPIRATORY_TRACT

## 2012-12-11 NOTE — Pre-Procedure Instructions (Signed)
Sean Juarez  12/11/2012   Your procedure is scheduled on:  Thursday, August 21  Report to [facility] at 0530 AM.  Call this number if you have problems the morning of surgery: [phone]   Remember:   Do not eat food or drink liquids after midnight.Wednesday night   Take these medicines the morning of surgery with A SIP OF WATER: Metoprolol   Do not wear jewelry, make-up or nail polish.  Do not wear lotions, powders, or perfumes. You may not wear deodorant.  Do not shave 48 hours prior to surgery. Men may shave face and neck.  Do not bring valuables to the hospital.  [facility] is not responsible  for any belongings or valuables.  Contacts, dentures or bridgework may not be worn into surgery.  Leave suitcase in the car. After surgery it may be brought to your room.  For patients admitted to the hospital, checkout time is 11:00 AM the day of  discharge.   Special Instructions: Incentive Spirometry - Practice and bring it with you on the day of surgery. Shower using CHG 2 nights before surgery and the night before surgery.  If you shower the day of surgery use CHG.  Use special wash - you have one bottle of CHG for all showers.  You should use approximately 1/3 of the bottle for each shower.   Please read over the following fact sheets that you were given: Pain Booklet, Coughing and Deep Breathing, Blood Transfusion Information, Open Heart Packet and Surgical Site Infection Prevention

## 2012-12-11 NOTE — Telephone Encounter (Signed)
RX for Mupirocin 2% ointment called to pharm with instructions of use.

## 2012-12-11 NOTE — Progress Notes (Signed)
Pre-op Cardiac Surgery  Carotid Findings:  Findings are consistent with 1-39% internal carotid artery stenosis bilaterally. Vertebral arteries are patent with antegrade flow.  Upper Extremity Right Left  Brachial Pressures 133-Triphasic 125-Triphasic  Radial Waveforms Triphasic Triphasic  Ulnar Waveforms Triphasic Triphasic  Palmar Arch (Allen's Test) Signal is unaffected with radial compression, obliterates with ulnar compression. Within normal limits.    12/11/2012 3:20 PM Gertie Fey, RVT, RDCS, RDMS

## 2012-12-12 NOTE — Progress Notes (Addendum)
Anesthesia chart review: Patient is a 42 year old male scheduled for Bentall procedure, possible mitral valve repair on 12/18/2012 (changed from 12/15/2012) by Dr. Laneta Simmers. Notes also indicate that he will undergo biopsy for mediastinal adenopathy.   History includes nonsmoker, hypertension, moderate to severe aortic regurgitation with descending aortic aneurysm with dissection, moderate mitral regurgitation, congestive heart failure, atrial flutter. PCP is Dr. Meredith Mody.  Cardiologist is Dr. Antoine Poche.  EKG on 12/11/2012 showed atrial flutter 3:1 AV conduction, 88 bpm, left anterior fascicular block, possible inferior infarct, age undetermined, T./T wave abnormality consider inferior lateral ischemia. Overall it appears stable since 10/27/2012.  Cardiac CT on 11/14/12 showed: 1) Calcium scar 102 single area of calcification in proximal LAD (< 30%) 2) Right dominant arteries with no evidence of high grade disease that would need bypass. Although the study is not ideal I (Dr. Eden Emms) think it is diagnostic enough to avoid the risk of invasive cath  3) Focal ascending aortic dissection with brisk filling of the false lumen and combined diameter of 6.3 cm No involvement of aortic arch  4) Mediastinal adenopathy   Echo on 11/12/12 showed: - Left ventricle: Inferobasal and distal septal hypokinesis. The cavity size was normal. Wall thickness was increased in a pattern of mild LVH. Systolic function was normal. The estimated ejection fraction was in the range of 50% to 55%. - Aortic valve: Moderate to severe regurgitation. - Aorta: Ascending aorta is severely dilated with disection plane seen above right coronary cusp. - Mitral valve: Moderate regurgitation. - Left atrium: The atrium was mildly dilated. - Atrial septum: No defect or patent foramen ovale was identified. - Pulmonary arteries: PA peak pressure: 57mm Hg (S).  PFT on 12/11/12: FVC 3.23 (62%), FEV1 2.32 (55%), DLCO 79%.  Preliminary carotid  duplex on 12/11/12 showed 1-39% bilateral ICA stenosis, antegrade vertebral artery flow.  Preoperative chest x-ray and labs noted.  Patient has known aflutter that remains rate controlled.  Anticipate that he can proceed as planned.  Velna Ochs [facility] Stay [facility]/Anesthesiology Phone [phone] 12/12/2012 9:44 AM

## 2012-12-17 MED ORDER — SODIUM CHLORIDE 0.9 % IV SOLN
INTRAVENOUS | Status: DC
  Filled 2012-12-17: qty 30

## 2012-12-17 MED ORDER — MAGNESIUM SULFATE 50 % IJ SOLN
40.0000 meq | INTRAMUSCULAR | Status: DC
  Filled 2012-12-17: qty 10

## 2012-12-17 MED ORDER — EPINEPHRINE HCL 1 MG/ML IJ SOLN
0.5000 ug/min | INTRAVENOUS | Status: DC
  Filled 2012-12-17: qty 4

## 2012-12-17 MED ORDER — NITROGLYCERIN IN D5W 200-5 MCG/ML-% IV SOLN
2.0000 ug/min | INTRAVENOUS | Status: DC
  Filled 2012-12-17: qty 250

## 2012-12-17 MED ORDER — POTASSIUM CHLORIDE 2 MEQ/ML IV SOLN
80.0000 meq | INTRAVENOUS | Status: DC
  Filled 2012-12-17: qty 40

## 2012-12-17 MED ORDER — SODIUM CHLORIDE 0.9 % IV SOLN
INTRAVENOUS | Status: AC
  Administered 2012-12-18: 1 [IU]/h via INTRAVENOUS
  Filled 2012-12-17: qty 1

## 2012-12-17 MED ORDER — DOPAMINE-DEXTROSE 3.2-5 MG/ML-% IV SOLN
2.0000 ug/kg/min | INTRAVENOUS | Status: AC
  Administered 2012-12-18: 3 ug/kg/min via INTRAVENOUS
  Filled 2012-12-17: qty 250

## 2012-12-17 MED ORDER — DEXTROSE 5 % IV SOLN
750.0000 mg | INTRAVENOUS | Status: DC
  Filled 2012-12-17: qty 750

## 2012-12-17 MED ORDER — DEXMEDETOMIDINE HCL IN NACL 400 MCG/100ML IV SOLN
0.1000 ug/kg/h | INTRAVENOUS | Status: AC
  Administered 2012-12-18: 0.2 ug/kg/h via INTRAVENOUS
  Filled 2012-12-17: qty 100

## 2012-12-17 MED ORDER — PLASMA-LYTE 148 IV SOLN
INTRAVENOUS | Status: DC
  Filled 2012-12-17: qty 2.5

## 2012-12-17 MED ORDER — DEXTROSE 5 % IV SOLN
1.5000 g | INTRAVENOUS | Status: AC
  Administered 2012-12-18: .75 g via INTRAVENOUS
  Administered 2012-12-18: 1.5 g via INTRAVENOUS
  Filled 2012-12-17: qty 1.5

## 2012-12-17 MED ORDER — VANCOMYCIN HCL 10 G IV SOLR
1500.0000 mg | INTRAVENOUS | Status: AC
  Administered 2012-12-18: 1500 mg via INTRAVENOUS
  Filled 2012-12-17: qty 1500

## 2012-12-17 MED ORDER — PHENYLEPHRINE HCL 10 MG/ML IJ SOLN
30.0000 ug/min | INTRAVENOUS | Status: AC
  Administered 2012-12-18: 15 ug/min via INTRAVENOUS
  Filled 2012-12-17: qty 2

## 2012-12-17 MED ORDER — SODIUM CHLORIDE 0.9 % IV SOLN
INTRAVENOUS | Status: AC
  Administered 2012-12-18: 69.8 mL/h via INTRAVENOUS
  Filled 2012-12-17: qty 40

## 2012-12-17 MED ORDER — METOPROLOL TARTRATE 12.5 MG HALF TABLET: 12.5000 mg | ORAL_TABLET | Freq: Once | ORAL | Status: DC

## 2012-12-18 ENCOUNTER — Inpatient Hospital Stay (HOSPITAL_COMMUNITY)
Admission: RE | Admit: 2012-12-18 | Discharge: 2012-12-23 | DRG: 545 | Disposition: A | Discharge status: Home / Self Care | Payer: BC Managed Care – PPO | Source: Ambulatory Visit | Attending: Surgery | Admitting: Surgery

## 2012-12-18 ENCOUNTER — Encounter (HOSPITAL_COMMUNITY): Payer: Self-pay | Admitting: *Deleted

## 2012-12-18 ENCOUNTER — Encounter (HOSPITAL_COMMUNITY)
Admission: RE | Disposition: A | Discharge status: Home / Self Care | Payer: Self-pay | Source: Ambulatory Visit | Attending: Surgery

## 2012-12-18 ENCOUNTER — Inpatient Hospital Stay (HOSPITAL_COMMUNITY): Payer: BC Managed Care – PPO

## 2012-12-18 ENCOUNTER — Inpatient Hospital Stay (HOSPITAL_COMMUNITY): Payer: BC Managed Care – PPO | Admitting: Certified Registered"

## 2012-12-18 ENCOUNTER — Encounter (HOSPITAL_COMMUNITY): Payer: Self-pay | Admitting: Vascular Surgery

## 2012-12-18 DIAGNOSIS — I71019 Dissection of thoracic aorta, unspecified: Secondary | ICD-10-CM | POA: Diagnosis present

## 2012-12-18 DIAGNOSIS — R599 Enlarged lymph nodes, unspecified: Secondary | ICD-10-CM

## 2012-12-18 DIAGNOSIS — I712 Thoracic aortic aneurysm, without rupture, unspecified: Secondary | ICD-10-CM

## 2012-12-18 DIAGNOSIS — I2789 Other specified pulmonary heart diseases: Secondary | ICD-10-CM | POA: Diagnosis present

## 2012-12-18 DIAGNOSIS — I10 Essential (primary) hypertension: Secondary | ICD-10-CM | POA: Diagnosis present

## 2012-12-18 DIAGNOSIS — I059 Rheumatic mitral valve disease, unspecified: Secondary | ICD-10-CM

## 2012-12-18 DIAGNOSIS — I7100 Dissection of unspecified site of aorta: Secondary | ICD-10-CM

## 2012-12-18 DIAGNOSIS — I080 Rheumatic disorders of both mitral and aortic valves: Principal | ICD-10-CM | POA: Diagnosis present

## 2012-12-18 DIAGNOSIS — D72829 Elevated white blood cell count, unspecified: Secondary | ICD-10-CM | POA: Diagnosis not present

## 2012-12-18 DIAGNOSIS — I509 Heart failure, unspecified: Secondary | ICD-10-CM | POA: Diagnosis present

## 2012-12-18 DIAGNOSIS — I359 Nonrheumatic aortic valve disorder, unspecified: Secondary | ICD-10-CM

## 2012-12-18 DIAGNOSIS — I739 Peripheral vascular disease, unspecified: Secondary | ICD-10-CM | POA: Diagnosis present

## 2012-12-18 DIAGNOSIS — I7101 Dissection of thoracic aorta: Secondary | ICD-10-CM | POA: Diagnosis present

## 2012-12-18 DIAGNOSIS — D696 Thrombocytopenia, unspecified: Secondary | ICD-10-CM | POA: Diagnosis present

## 2012-12-18 DIAGNOSIS — D62 Acute posthemorrhagic anemia: Secondary | ICD-10-CM | POA: Diagnosis present

## 2012-12-18 DIAGNOSIS — Z79899 Other long term (current) drug therapy: Secondary | ICD-10-CM

## 2012-12-18 DIAGNOSIS — E669 Obesity, unspecified: Secondary | ICD-10-CM | POA: Diagnosis present

## 2012-12-18 HISTORY — PX: BIOPSY OF MEDIASTINAL MASS: SHX6389

## 2012-12-18 HISTORY — PX: BENTALL PROCEDURE: SHX5058

## 2012-12-18 HISTORY — PX: INTRAOPERATIVE TRANSESOPHAGEAL ECHOCARDIOGRAM: SHX5062

## 2012-12-18 HISTORY — PX: MITRAL VALVE REPAIR: SHX2039

## 2012-12-18 LAB — POCT I-STAT 3, ART BLOOD GAS (G3+)
Acid-Base Excess: 1 mmol/L (ref 0.0–2.0)
Acid-base deficit: 1 mmol/L (ref 0.0–2.0)
Bicarbonate: 28.4 mEq/L — ABNORMAL HIGH (ref 20.0–24.0)
Bicarbonate: 27.2 mEq/L — ABNORMAL HIGH (ref 20.0–24.0)
Bicarbonate: 27.3 mEq/L — ABNORMAL HIGH (ref 20.0–24.0)
Bicarbonate: 25.2 mEq/L — ABNORMAL HIGH (ref 20.0–24.0)
Bicarbonate: 24.2 mEq/L — ABNORMAL HIGH (ref 20.0–24.0)
Bicarbonate: 25.5 mEq/L — ABNORMAL HIGH (ref 20.0–24.0)
O2 Saturation: 100 %
O2 Saturation: 100 %
Patient temperature: 34.9
Patient temperature: 37.2
TCO2: 30 mmol/L (ref 0–100)
TCO2: 28 mmol/L (ref 0–100)
TCO2: 29 mmol/L (ref 0–100)
TCO2: 27 mmol/L (ref 0–100)
TCO2: 27 mmol/L (ref 0–100)
pCO2 arterial: 52.3 mmHg — ABNORMAL HIGH (ref 35.0–45.0)
pCO2 arterial: 53.6 mmHg — ABNORMAL HIGH (ref 35.0–45.0)
pCO2 arterial: 35.3 mmHg (ref 35.0–45.0)
pCO2 arterial: 32.9 mmHg — ABNORMAL LOW (ref 35.0–45.0)
pCO2 arterial: 48.2 mmHg — ABNORMAL HIGH (ref 35.0–45.0)
pCO2 arterial: 46.3 mmHg — ABNORMAL HIGH (ref 35.0–45.0)
pCO2 arterial: 37.3 mmHg (ref 35.0–45.0)
pH, Arterial: 7.315 — ABNORMAL LOW (ref 7.350–7.450)
pH, Arterial: 7.343 — ABNORMAL LOW (ref 7.350–7.450)
pH, Arterial: 7.497 — ABNORMAL HIGH (ref 7.350–7.450)
pH, Arterial: 7.495 — ABNORMAL HIGH (ref 7.350–7.450)
pH, Arterial: 7.261 — ABNORMAL LOW (ref 7.350–7.450)
pH, Arterial: 7.325 — ABNORMAL LOW (ref 7.350–7.450)
pH, Arterial: 7.427 (ref 7.350–7.450)
pH, Arterial: 7.429 (ref 7.350–7.450)
pO2, Arterial: 296 mmHg — ABNORMAL HIGH (ref 80.0–100.0)
pO2, Arterial: 298 mmHg — ABNORMAL HIGH (ref 80.0–100.0)
pO2, Arterial: 288 mmHg — ABNORMAL HIGH (ref 80.0–100.0)
pO2, Arterial: 67 mmHg — ABNORMAL LOW (ref 80.0–100.0)
pO2, Arterial: 77 mmHg — ABNORMAL LOW (ref 80.0–100.0)
pO2, Arterial: 58 mmHg — ABNORMAL LOW (ref 80.0–100.0)

## 2012-12-18 LAB — POCT I-STAT 4, (NA,K, GLUC, HGB,HCT)
Glucose, Bld: 115 mg/dL — ABNORMAL HIGH (ref 70–99)
Glucose, Bld: 101 mg/dL — ABNORMAL HIGH (ref 70–99)
Glucose, Bld: 106 mg/dL — ABNORMAL HIGH (ref 70–99)
Glucose, Bld: 111 mg/dL — ABNORMAL HIGH (ref 70–99)
HCT: 35 % — ABNORMAL LOW (ref 39.0–52.0)
HCT: 34 % — ABNORMAL LOW (ref 39.0–52.0)
HCT: 28 % — ABNORMAL LOW (ref 39.0–52.0)
HCT: 26 % — ABNORMAL LOW (ref 39.0–52.0)
HCT: 23 % — ABNORMAL LOW (ref 39.0–52.0)
HCT: 38 % — ABNORMAL LOW (ref 39.0–52.0)
HCT: 40 % (ref 39.0–52.0)
Hemoglobin: 11.9 g/dL — ABNORMAL LOW (ref 13.0–17.0)
Hemoglobin: 9.5 g/dL — ABNORMAL LOW (ref 13.0–17.0)
Hemoglobin: 8.8 g/dL — ABNORMAL LOW (ref 13.0–17.0)
Hemoglobin: 9.5 g/dL — ABNORMAL LOW (ref 13.0–17.0)
Hemoglobin: 12.9 g/dL — ABNORMAL LOW (ref 13.0–17.0)
Hemoglobin: 13.6 g/dL (ref 13.0–17.0)
Potassium: 3.1 mEq/L — ABNORMAL LOW (ref 3.5–5.1)
Potassium: 3.5 mEq/L (ref 3.5–5.1)
Potassium: 4 mEq/L (ref 3.5–5.1)
Sodium: 143 mEq/L (ref 135–145)
Sodium: 142 mEq/L (ref 135–145)
Sodium: 138 mEq/L (ref 135–145)
Sodium: 139 mEq/L (ref 135–145)

## 2012-12-18 LAB — POCT I-STAT, CHEM 8
BUN: 12 mg/dL (ref 6–23)
Calcium, Ion: 1.14 mmol/L (ref 1.12–1.23)
Creatinine, Ser: 0.9 mg/dL (ref 0.50–1.35)
Glucose, Bld: 98 mg/dL (ref 70–99)
Hemoglobin: 13.6 g/dL (ref 13.0–17.0)
Sodium: 142 mEq/L (ref 135–145)
TCO2: 23 mmol/L (ref 0–100)

## 2012-12-18 LAB — DIC (DISSEMINATED INTRAVASCULAR COAGULATION)PANEL
D-Dimer, Quant: 1.98 ug/mL-FEU — ABNORMAL HIGH (ref 0.00–0.48)
INR: 1.78 — ABNORMAL HIGH (ref 0.00–1.49)
Prothrombin Time: 20.2 seconds — ABNORMAL HIGH (ref 11.6–15.2)

## 2012-12-18 LAB — CBC
Hemoglobin: 12.3 g/dL — ABNORMAL LOW (ref 13.0–17.0)
MCH: 29.8 pg (ref 26.0–34.0)
MCH: 29.9 pg (ref 26.0–34.0)
MCV: 90.2 fL (ref 78.0–100.0)
Platelets: 120 10*3/uL — ABNORMAL LOW (ref 150–400)
RBC: 4.12 MIL/uL — ABNORMAL LOW (ref 4.22–5.81)
RDW: 17 % — ABNORMAL HIGH (ref 11.5–15.5)

## 2012-12-18 LAB — PLATELET COUNT: Platelets: 80 10*3/uL — ABNORMAL LOW (ref 150–400)

## 2012-12-18 LAB — POCT I-STAT GLUCOSE
Glucose, Bld: 109 mg/dL — ABNORMAL HIGH (ref 70–99)
Operator id: 3293

## 2012-12-18 LAB — CREATININE, SERUM
Creatinine, Ser: 0.72 mg/dL (ref 0.50–1.35)
GFR calc Af Amer: >90 mL/min (ref >90)

## 2012-12-18 LAB — MAGNESIUM: Magnesium: 2.6 mg/dL — ABNORMAL HIGH (ref 1.5–2.5)

## 2012-12-18 LAB — PROTIME-INR: Prothrombin Time: 11.1 seconds — ABNORMAL LOW (ref 11.6–15.2)

## 2012-12-18 SURGERY — BENTALL PROCEDURE
Anesthesia: General | Site: Chest | Wound class: Clean

## 2012-12-18 MED ORDER — SODIUM CHLORIDE 0.9 % IV SOLN
INTRAVENOUS | Status: DC
  Administered 2012-12-19: 10 mL/h via INTRAVENOUS

## 2012-12-18 MED ORDER — PROPOFOL 10 MG/ML IV BOLUS
INTRAVENOUS | Status: DC | PRN
  Administered 2012-12-18: 70 mg via INTRAVENOUS

## 2012-12-18 MED ORDER — OXYCODONE HCL 5 MG PO TABS
5.0000 mg | ORAL_TABLET | ORAL | Status: DC | PRN
  Administered 2012-12-19: 5 mg via ORAL
  Administered 2012-12-19: 10 mg via ORAL
  Administered 2012-12-19: 5 mg via ORAL
  Administered 2012-12-20 (×2): 10 mg via ORAL
  Administered 2012-12-20: 5 mg via ORAL
  Administered 2012-12-20: 10 mg via ORAL
  Administered 2012-12-22: 5 mg via ORAL
  Filled 2012-12-18 (×2): qty 1
  Filled 2012-12-18: qty 2
  Filled 2012-12-18 (×2): qty 1
  Filled 2012-12-18 (×4): qty 2

## 2012-12-18 MED ORDER — ASPIRIN 81 MG PO CHEW: 324.0000 mg | CHEWABLE_TABLET | Freq: Every day | ORAL | Status: DC

## 2012-12-18 MED ORDER — HEMOSTATIC AGENTS (NO CHARGE) OPTIME
TOPICAL | Status: DC | PRN
  Administered 2012-12-18: 1 via TOPICAL

## 2012-12-18 MED ORDER — DEXMEDETOMIDINE HCL IN NACL 200 MCG/50ML IV SOLN
0.4000 ug/kg/h | INTRAVENOUS | Status: DC
  Filled 2012-12-18: qty 50

## 2012-12-18 MED ORDER — ARTIFICIAL TEARS OP OINT
TOPICAL_OINTMENT | OPHTHALMIC | Status: DC | PRN
  Administered 2012-12-18: 1 via OPHTHALMIC

## 2012-12-18 MED ORDER — POTASSIUM CHLORIDE 10 MEQ/50ML IV SOLN
10.0000 meq | Freq: Once | INTRAVENOUS | Status: AC
  Administered 2012-12-18: 10 meq via INTRAVENOUS

## 2012-12-18 MED ORDER — CALCIUM CHLORIDE 10 % IV SOLN
INTRAVENOUS | Status: DC | PRN
  Administered 2012-12-18: 100 mg via INTRAVENOUS

## 2012-12-18 MED ORDER — ROCURONIUM BROMIDE 100 MG/10ML IV SOLN
INTRAVENOUS | Status: DC | PRN
  Administered 2012-12-18: 100 mg via INTRAVENOUS

## 2012-12-18 MED ORDER — FAMOTIDINE IN NACL 20-0.9 MG/50ML-% IV SOLN
20.0000 mg | Freq: Two times a day (BID) | INTRAVENOUS | Status: DC
  Administered 2012-12-18: 20 mg via INTRAVENOUS

## 2012-12-18 MED ORDER — 0.9 % SODIUM CHLORIDE (POUR BTL) OPTIME
TOPICAL | Status: DC | PRN
  Administered 2012-12-18: 6000 mL

## 2012-12-18 MED ORDER — ACETAMINOPHEN 650 MG RE SUPP
650.0000 mg | Freq: Once | RECTAL | Status: AC
  Administered 2012-12-18: 650 mg via RECTAL

## 2012-12-18 MED ORDER — PHENYLEPHRINE HCL 10 MG/ML IJ SOLN
0.0000 ug/min | INTRAVENOUS | Status: DC
  Filled 2012-12-18 (×2): qty 2

## 2012-12-18 MED ORDER — LACTATED RINGERS IV SOLN
INTRAVENOUS | Status: DC
  Administered 2012-12-18: 20 mL/h via INTRAVENOUS

## 2012-12-18 MED ORDER — SODIUM CHLORIDE 0.9 % IV SOLN
250.0000 mL | INTRAVENOUS | Status: DC
  Administered 2012-12-19: 250 mL via INTRAVENOUS

## 2012-12-18 MED ORDER — MAGNESIUM SULFATE 40 MG/ML IJ SOLN
4.0000 g | Freq: Once | INTRAMUSCULAR | Status: AC
  Administered 2012-12-18: 4 g via INTRAVENOUS

## 2012-12-18 MED ORDER — INSULIN ASPART 100 UNIT/ML ~~LOC~~ SOLN
0.0000 [IU] | SUBCUTANEOUS | Status: AC
  Administered 2012-12-19 (×2): 2 [IU] via SUBCUTANEOUS

## 2012-12-18 MED ORDER — MIDAZOLAM HCL 5 MG/5ML IJ SOLN
INTRAMUSCULAR | Status: DC | PRN
  Administered 2012-12-18: 3 mg via INTRAVENOUS
  Administered 2012-12-18 (×3): 2 mg via INTRAVENOUS

## 2012-12-18 MED ORDER — ASPIRIN EC 325 MG PO TBEC
325.0000 mg | DELAYED_RELEASE_TABLET | Freq: Every day | ORAL | Status: DC
  Administered 2012-12-19 – 2012-12-22 (×4): 325 mg via ORAL
  Filled 2012-12-18 (×4): qty 1

## 2012-12-18 MED ORDER — LACTATED RINGERS IV SOLN
INTRAVENOUS | Status: DC | PRN
  Administered 2012-12-18 (×3): via INTRAVENOUS

## 2012-12-18 MED ORDER — METOPROLOL TARTRATE 25 MG/10 ML ORAL SUSPENSION
12.5000 mg | Freq: Two times a day (BID) | ORAL | Status: DC
  Administered 2012-12-18: 12.5 mg
  Filled 2012-12-18 (×3): qty 5

## 2012-12-18 MED ORDER — FENTANYL CITRATE 0.05 MG/ML IJ SOLN
INTRAMUSCULAR | Status: DC | PRN
  Administered 2012-12-18: 250 ug via INTRAVENOUS
  Administered 2012-12-18: 200 ug via INTRAVENOUS
  Administered 2012-12-18 (×2): 50 ug via INTRAVENOUS
  Administered 2012-12-18: 250 ug via INTRAVENOUS
  Administered 2012-12-18 (×2): 100 ug via INTRAVENOUS
  Administered 2012-12-18: 200 ug via INTRAVENOUS
  Administered 2012-12-18: 500 ug via INTRAVENOUS
  Administered 2012-12-18: 150 ug via INTRAVENOUS

## 2012-12-18 MED ORDER — MUPIROCIN 2 % EX OINT
TOPICAL_OINTMENT | Freq: Two times a day (BID) | CUTANEOUS | Status: DC
  Administered 2012-12-18 – 2012-12-21 (×5): via TOPICAL
  Filled 2012-12-18 (×2): qty 22

## 2012-12-18 MED ORDER — SODIUM CHLORIDE 0.9 % IV SOLN
INTRAVENOUS | Status: DC
  Administered 2012-12-18: 0.7 [IU]/h via INTRAVENOUS
  Filled 2012-12-18 (×2): qty 1

## 2012-12-18 MED ORDER — BISACODYL 5 MG PO TBEC
10.0000 mg | DELAYED_RELEASE_TABLET | Freq: Every day | ORAL | Status: DC
  Administered 2012-12-19 – 2012-12-21 (×3): 10 mg via ORAL
  Filled 2012-12-18 (×4): qty 2

## 2012-12-18 MED ORDER — ALBUMIN HUMAN 5 % IV SOLN: 250.0000 mL | INTRAVENOUS | Status: AC | PRN

## 2012-12-18 MED ORDER — SODIUM CHLORIDE 0.9 % IJ SOLN: 3.0000 mL | INTRAMUSCULAR | Status: DC | PRN

## 2012-12-18 MED ORDER — MORPHINE SULFATE 2 MG/ML IJ SOLN
2.0000 mg | INTRAMUSCULAR | Status: DC | PRN
  Administered 2012-12-19 – 2012-12-20 (×8): 2 mg via INTRAVENOUS
  Filled 2012-12-18 (×8): qty 1

## 2012-12-18 MED ORDER — ACETAMINOPHEN 160 MG/5ML PO SOLN: 650.0000 mg | Freq: Once | ORAL | Status: AC

## 2012-12-18 MED ORDER — HEPARIN SODIUM (PORCINE) 1000 UNIT/ML IJ SOLN
INTRAMUSCULAR | Status: DC | PRN
  Administered 2012-12-18: 30000 [IU] via INTRAVENOUS

## 2012-12-18 MED ORDER — DOPAMINE-DEXTROSE 3.2-5 MG/ML-% IV SOLN: 0.0000 ug/kg/min | INTRAVENOUS | Status: DC

## 2012-12-18 MED ORDER — DEXTROSE 5 % IV SOLN
1.5000 g | Freq: Two times a day (BID) | INTRAVENOUS | Status: AC
  Administered 2012-12-18 – 2012-12-20 (×4): 1.5 g via INTRAVENOUS
  Filled 2012-12-18 (×5): qty 1.5

## 2012-12-18 MED ORDER — SODIUM CHLORIDE 0.45 % IV SOLN
INTRAVENOUS | Status: DC
  Administered 2012-12-18 – 2012-12-20 (×2): 20 mL/h via INTRAVENOUS

## 2012-12-18 MED ORDER — FENTANYL CITRATE 0.05 MG/ML IJ SOLN
INTRAMUSCULAR | Status: AC
  Filled 2012-12-18: qty 2

## 2012-12-18 MED ORDER — MIDAZOLAM HCL 2 MG/2ML IJ SOLN: 2.0000 mg | INTRAMUSCULAR | Status: DC | PRN

## 2012-12-18 MED ORDER — SODIUM CHLORIDE 0.9 % IV SOLN
INTRAVENOUS | Status: DC | PRN
  Administered 2012-12-18: 14:00:00 via INTRAVENOUS

## 2012-12-18 MED ORDER — DEXMEDETOMIDINE HCL IN NACL 200 MCG/50ML IV SOLN
0.1000 ug/kg/h | INTRAVENOUS | Status: DC
  Administered 2012-12-18: 0.7 ug/kg/h via INTRAVENOUS
  Filled 2012-12-18: qty 50

## 2012-12-18 MED ORDER — LACTATED RINGERS IV SOLN: 500.0000 mL | Freq: Once | INTRAVENOUS | Status: AC | PRN

## 2012-12-18 MED ORDER — FAMOTIDINE IN NACL 20-0.9 MG/50ML-% IV SOLN: 20.0000 mg | Freq: Two times a day (BID) | INTRAVENOUS | Status: DC

## 2012-12-18 MED ORDER — DOCUSATE SODIUM 100 MG PO CAPS
200.0000 mg | ORAL_CAPSULE | Freq: Every day | ORAL | Status: DC
  Administered 2012-12-19 – 2012-12-23 (×4): 200 mg via ORAL
  Filled 2012-12-18 (×5): qty 2

## 2012-12-18 MED ORDER — MILRINONE IN DEXTROSE 20 MG/100ML IV SOLN
0.2500 ug/kg/min | INTRAVENOUS | Status: AC
  Administered 2012-12-18: .3 ug/kg/min via INTRAVENOUS
  Filled 2012-12-18: qty 100

## 2012-12-18 MED ORDER — ACETAMINOPHEN 160 MG/5ML PO SOLN
1000.0000 mg | Freq: Four times a day (QID) | ORAL | Status: DC
  Filled 2012-12-18: qty 40

## 2012-12-18 MED ORDER — INSULIN REGULAR BOLUS VIA INFUSION
0.0000 [IU] | Freq: Three times a day (TID) | INTRAVENOUS | Status: DC
  Filled 2012-12-18: qty 10

## 2012-12-18 MED ORDER — THROMBIN 20000 UNITS EX SOLR
CUTANEOUS | Status: AC
  Filled 2012-12-18: qty 20000

## 2012-12-18 MED ORDER — NITROGLYCERIN IN D5W 200-5 MCG/ML-% IV SOLN
0.0000 ug/min | INTRAVENOUS | Status: DC
  Administered 2012-12-19: 85 ug/min via INTRAVENOUS
  Filled 2012-12-18: qty 250

## 2012-12-18 MED ORDER — THROMBIN 20000 UNITS EX SOLR
CUTANEOUS | Status: DC | PRN
  Administered 2012-12-18 (×3): via TOPICAL

## 2012-12-18 MED ORDER — VECURONIUM BROMIDE 10 MG IV SOLR
INTRAVENOUS | Status: DC | PRN
  Administered 2012-12-18 (×2): 4 mg via INTRAVENOUS
  Administered 2012-12-18: 6 mg via INTRAVENOUS

## 2012-12-18 MED ORDER — VANCOMYCIN HCL IN DEXTROSE 1-5 GM/200ML-% IV SOLN
1000.0000 mg | Freq: Once | INTRAVENOUS | Status: AC
  Administered 2012-12-18: 1000 mg via INTRAVENOUS
  Filled 2012-12-18 (×2): qty 200

## 2012-12-18 MED ORDER — CHLORHEXIDINE GLUCONATE 4 % EX LIQD: 30.0000 mL | CUTANEOUS | Status: DC

## 2012-12-18 MED ORDER — METOPROLOL TARTRATE 1 MG/ML IV SOLN
2.5000 mg | INTRAVENOUS | Status: DC | PRN
  Administered 2012-12-19: 2.5 mg via INTRAVENOUS

## 2012-12-18 MED ORDER — MIDAZOLAM HCL 2 MG/2ML IJ SOLN
INTRAMUSCULAR | Status: AC
  Filled 2012-12-18: qty 2

## 2012-12-18 MED ORDER — POTASSIUM CHLORIDE 10 MEQ/50ML IV SOLN
10.0000 meq | INTRAVENOUS | Status: AC
  Administered 2012-12-18 (×3): 10 meq via INTRAVENOUS

## 2012-12-18 MED ORDER — COAGULATION FACTOR VIIA RECOMB 1 MG IV SOLR
45.0000 ug/kg | Freq: Once | INTRAVENOUS | Status: AC
  Administered 2012-12-18: 5000 ug via INTRAVENOUS
  Filled 2012-12-18: qty 5

## 2012-12-18 MED ORDER — THROMBIN 20000 UNITS EX SOLR
CUTANEOUS | Status: DC | PRN
  Administered 2012-12-18: 20000 [IU] via TOPICAL

## 2012-12-18 MED ORDER — SODIUM CHLORIDE 0.9 % IJ SOLN
3.0000 mL | Freq: Two times a day (BID) | INTRAMUSCULAR | Status: DC
  Administered 2012-12-19: 22:00:00 via INTRAVENOUS
  Administered 2012-12-19 – 2012-12-23 (×4): 3 mL via INTRAVENOUS

## 2012-12-18 MED ORDER — METOPROLOL TARTRATE 12.5 MG HALF TABLET
12.5000 mg | ORAL_TABLET | Freq: Two times a day (BID) | ORAL | Status: DC
  Filled 2012-12-18 (×3): qty 1

## 2012-12-18 MED ORDER — PROTAMINE SULFATE 10 MG/ML IV SOLN
INTRAVENOUS | Status: DC | PRN
  Administered 2012-12-18: 60 mg via INTRAVENOUS
  Administered 2012-12-18: 40 mg via INTRAVENOUS
  Administered 2012-12-18: 60 mg via INTRAVENOUS
  Administered 2012-12-18: 70 mg via INTRAVENOUS
  Administered 2012-12-18: 20 mg via INTRAVENOUS

## 2012-12-18 MED ORDER — MILRINONE IN DEXTROSE 20 MG/100ML IV SOLN
0.3000 ug/kg/min | INTRAVENOUS | Status: DC
  Administered 2012-12-18 (×2): 0.3 ug/kg/min via INTRAVENOUS
  Administered 2012-12-19: 0.2 ug/kg/min via INTRAVENOUS
  Filled 2012-12-18 (×2): qty 100

## 2012-12-18 MED ORDER — ONDANSETRON HCL 4 MG/2ML IJ SOLN
4.0000 mg | Freq: Four times a day (QID) | INTRAMUSCULAR | Status: DC | PRN
  Administered 2012-12-18: 4 mg via INTRAVENOUS
  Filled 2012-12-18: qty 2

## 2012-12-18 MED ORDER — FENTANYL CITRATE 0.05 MG/ML IJ SOLN: INTRAMUSCULAR | Status: DC | PRN

## 2012-12-18 MED ORDER — MUPIROCIN 2 % EX OINT
1.0000 "application " | TOPICAL_OINTMENT | Freq: Two times a day (BID) | CUTANEOUS | Status: DC
  Administered 2012-12-18 – 2012-12-22 (×8): 1 via NASAL
  Filled 2012-12-18: qty 22

## 2012-12-18 MED ORDER — CHLORHEXIDINE GLUCONATE CLOTH 2 % EX PADS
6.0000 | MEDICATED_PAD | Freq: Every day | CUTANEOUS | Status: AC
  Administered 2012-12-18 – 2012-12-21 (×4): 6 via TOPICAL

## 2012-12-18 MED ORDER — ACETAMINOPHEN 500 MG PO TABS
1000.0000 mg | ORAL_TABLET | Freq: Four times a day (QID) | ORAL | Status: DC
  Administered 2012-12-19 – 2012-12-23 (×13): 1000 mg via ORAL
  Filled 2012-12-18 (×20): qty 2

## 2012-12-18 MED ORDER — PANTOPRAZOLE SODIUM 40 MG PO TBEC
40.0000 mg | DELAYED_RELEASE_TABLET | Freq: Every day | ORAL | Status: DC
  Administered 2012-12-20 – 2012-12-23 (×4): 40 mg via ORAL
  Filled 2012-12-18 (×4): qty 1

## 2012-12-18 MED ORDER — BISACODYL 10 MG RE SUPP: 10.0000 mg | Freq: Every day | RECTAL | Status: DC

## 2012-12-18 MED ORDER — MORPHINE SULFATE 2 MG/ML IJ SOLN: 1.0000 mg | INTRAMUSCULAR | Status: AC | PRN

## 2012-12-18 MED ORDER — SODIUM CHLORIDE 0.9 % IV SOLN
0.5000 g/h | INTRAVENOUS | Status: AC
  Administered 2012-12-18: 1 g/h via INTRAVENOUS
  Filled 2012-12-18: qty 20

## 2012-12-18 SURGICAL SUPPLY — 103 items
ADAPTER CARDIO PERF ANTE/RETRO (ADAPTER) ×3 IMPLANT
ANTEGRADE CPLG (MISCELLANEOUS) ×3 IMPLANT
APPLICATOR TIP COSEAL (VASCULAR PRODUCTS) ×6 IMPLANT
ATTRACTOMAT 16X20 MAGNETIC DRP (DRAPES) ×3 IMPLANT
BAG DECANTER FOR FLEXI CONT (MISCELLANEOUS) ×3 IMPLANT
BLADE STERNUM SYSTEM 6 (BLADE) ×3 IMPLANT
BLADE SURG 15 STRL LF DISP TIS (BLADE) ×2 IMPLANT
BLADE SURG 15 STRL SS (BLADE) ×1
CANISTER SUCTION 2500CC (MISCELLANEOUS) ×3 IMPLANT
CANN PRFSN 3/8XCNCT ST RT ANG (MISCELLANEOUS) ×2
CANNULA ARTERIAL NVNT 3/8 22FR (MISCELLANEOUS) ×3 IMPLANT
CANNULA GUNDRY RCSP 15FR (MISCELLANEOUS) ×6 IMPLANT
CANNULA PRFSN 3/8XCNCT RT ANG (MISCELLANEOUS) ×2 IMPLANT
CANNULA RT ANGLE VENOUS 36FR (CANNULA) ×2 IMPLANT
CANNULA VEN MTL TIP RT (MISCELLANEOUS) ×1
CANNULAE RT ANGLE VENOUS 36FR (CANNULA) ×3
CATH ROBINSON RED A/P 18FR (CATHETERS) ×12 IMPLANT
CATH THORACIC 36FR (CATHETERS) ×3 IMPLANT
CATH THORACIC 36FR RT ANG (CATHETERS) ×3 IMPLANT
CATH/SQUID NICHOLS JEHLE COR (CATHETERS) ×3 IMPLANT
CAUTERY HIGH TEMP VAS (MISCELLANEOUS) ×3 IMPLANT
CAUTERY SURG HI TEMP FINE TIP (MISCELLANEOUS) ×3 IMPLANT
CLOTH BEACON ORANGE TIMEOUT ST (SAFETY) ×3 IMPLANT
CONN 1/2X1/2X1/2  BEN (MISCELLANEOUS) ×1
CONN 1/2X1/2X1/2 BEN (MISCELLANEOUS) ×2 IMPLANT
CONN 3/8X1/2 ST GISH (MISCELLANEOUS) ×6 IMPLANT
CONT SPEC 4OZ CLIKSEAL STRL BL (MISCELLANEOUS) ×12 IMPLANT
COVER SURGICAL LIGHT HANDLE (MISCELLANEOUS) ×3 IMPLANT
CRADLE DONUT ADULT HEAD (MISCELLANEOUS) ×3 IMPLANT
DRAPE CARDIOVASCULAR INCISE (DRAPES) ×1
DRAPE SLUSH/WARMER DISC (DRAPES) ×3 IMPLANT
DRAPE SRG 135X102X78XABS (DRAPES) ×2 IMPLANT
DRSG COVADERM 4X14 (GAUZE/BANDAGES/DRESSINGS) ×3 IMPLANT
ELECT CAUTERY BLADE 6.4 (BLADE) ×3 IMPLANT
ELECT REM PT RETURN 9FT ADLT (ELECTROSURGICAL) ×6
ELECTRODE REM PT RTRN 9FT ADLT (ELECTROSURGICAL) ×4 IMPLANT
GLOVE BIO SURGEON STRL SZ 6 (GLOVE) IMPLANT
GLOVE BIO SURGEON STRL SZ 6.5 (GLOVE) ×9 IMPLANT
GLOVE BIO SURGEON STRL SZ7 (GLOVE) IMPLANT
GLOVE BIO SURGEON STRL SZ7.5 (GLOVE) ×3 IMPLANT
GLOVE EUDERMIC 7 POWDERFREE (GLOVE) ×9 IMPLANT
GOWN PREVENTION PLUS XLARGE (GOWN DISPOSABLE) ×6 IMPLANT
GOWN STRL NON-REIN LRG LVL3 (GOWN DISPOSABLE) ×15 IMPLANT
HEART VENT LT CURVED (MISCELLANEOUS) ×3 IMPLANT
HEMOSTAT POWDER SURGIFOAM 1G (HEMOSTASIS) ×9 IMPLANT
HEMOSTAT SURGICEL 2X14 (HEMOSTASIS) ×3 IMPLANT
KIT BASIN OR (CUSTOM PROCEDURE TRAY) ×3 IMPLANT
KIT DRAINAGE VACCUM ASSIST (KITS) ×3 IMPLANT
KIT ROOM TURNOVER OR (KITS) ×3 IMPLANT
KIT SUCTION CATH 14FR (SUCTIONS) ×3 IMPLANT
LINE VENT (MISCELLANEOUS) ×3 IMPLANT
LOOP VESSEL SUPERMAXI WHITE (MISCELLANEOUS) ×3 IMPLANT
NS IRRIG 1000ML POUR BTL (IV SOLUTION) ×18 IMPLANT
PACK OPEN HEART (CUSTOM PROCEDURE TRAY) ×3 IMPLANT
PAD ARMBOARD 7.5X6 YLW CONV (MISCELLANEOUS) ×6 IMPLANT
RING MITRAL MEMO 3D 30MM SMD30 (Prosthesis & Implant Heart) ×3 IMPLANT
SEALANT SURG COSEAL 8ML (VASCULAR PRODUCTS) ×3 IMPLANT
SET CARDIOPLEGIA MPS 5001102 (MISCELLANEOUS) ×3 IMPLANT
SPONGE GAUZE 4X4 12PLY (GAUZE/BANDAGES/DRESSINGS) ×3 IMPLANT
STOPCOCK 4 WAY LG BORE MALE ST (IV SETS) ×3 IMPLANT
SUCKER INTRACARDIAC WEIGHTED (SUCKER) ×3 IMPLANT
SUCKER WEIGHTED FLEX (MISCELLANEOUS) ×3 IMPLANT
SUT BONE WAX W31G (SUTURE) ×3 IMPLANT
SUT ETHIBON 2 0 V 52N 30 (SUTURE) ×6 IMPLANT
SUT ETHIBON EXCEL 2-0 V-5 (SUTURE) IMPLANT
SUT ETHIBOND (SUTURE) ×6 IMPLANT
SUT ETHIBOND 2 0 SH (SUTURE) ×1 IMPLANT
SUT ETHIBOND 2 0 SH 36X2 (SUTURE) ×2 IMPLANT
SUT ETHIBOND 2 0 V4 (SUTURE) IMPLANT
SUT ETHIBOND 2 0V4 GREEN (SUTURE) IMPLANT
SUT ETHIBOND 2-0 RB-1 WHT (SUTURE) ×6 IMPLANT
SUT ETHIBOND V-5 VALVE (SUTURE) IMPLANT
SUT PROLENE 3 0 SH 1 (SUTURE) ×3 IMPLANT
SUT PROLENE 3 0 SH DA (SUTURE) ×9 IMPLANT
SUT PROLENE 3 0 SH1 36 (SUTURE) ×9 IMPLANT
SUT PROLENE 4 0 RB 1 (SUTURE) ×4
SUT PROLENE 4 0 SH DA (SUTURE) ×12 IMPLANT
SUT PROLENE 4-0 RB1 .5 CRCL 36 (SUTURE) ×8 IMPLANT
SUT PROLENE 5 0 C 1 36 (SUTURE) ×9 IMPLANT
SUT PROLENE 5 0 RB 2 (SUTURE) ×6 IMPLANT
SUT SILK 2 0 SH CR/8 (SUTURE) ×3 IMPLANT
SUT STEEL 6MS V (SUTURE) IMPLANT
SUT STEEL STERNAL CCS#1 18IN (SUTURE) IMPLANT
SUT STEEL SZ 6 DBL 3X14 BALL (SUTURE) IMPLANT
SUT VIC AB 1 CTX 36 (SUTURE) ×2
SUT VIC AB 1 CTX36XBRD ANBCTR (SUTURE) ×4 IMPLANT
SUT VIC AB 2-0 CT1 27 (SUTURE)
SUT VIC AB 2-0 CT1 TAPERPNT 27 (SUTURE) IMPLANT
SUT VIC AB 3-0 X1 27 (SUTURE) IMPLANT
SUTURE E-PAK OPEN HEART (SUTURE) ×3 IMPLANT
SYSTEM SAHARA CHEST DRAIN ATS (WOUND CARE) ×3 IMPLANT
TAPE CLOTH SURG 4X10 WHT LF (GAUZE/BANDAGES/DRESSINGS) ×3 IMPLANT
TAPE PAPER 2X10 WHT MICROPORE (GAUZE/BANDAGES/DRESSINGS) ×3 IMPLANT
TOWEL OR 17X24 6PK STRL BLUE (TOWEL DISPOSABLE) ×6 IMPLANT
TOWEL OR 17X26 10 PK STRL BLUE (TOWEL DISPOSABLE) ×6 IMPLANT
TRAY FOLEY IC TEMP SENS 14FR (CATHETERS) ×3 IMPLANT
TUBE CONNECTING 12X1/4 (SUCTIONS) ×3 IMPLANT
TUBE SUCT INTRACARD DLP 20F (MISCELLANEOUS) ×6 IMPLANT
UNDERPAD 30X30 INCONTINENT (UNDERPADS AND DIAPERS) ×3 IMPLANT
VALVE AOR 27XLF MSTR (Prosthesis & Implant Heart) ×2 IMPLANT
VALVE AORTIC COND (Prosthesis & Implant Heart) ×1 IMPLANT
WATER STERILE IRR 1000ML POUR (IV SOLUTION) ×6 IMPLANT
YANKAUER SUCT BULB TIP NO VENT (SUCTIONS) ×3 IMPLANT

## 2012-12-18 NOTE — Interval H&P Note (Signed)
History and Physical Interval Note:  12/18/2012 7:43 AM  Sean Juarez  has presented today for surgery, with the diagnosis of aortic insufficiency, dilated aortic root  The various methods of treatment have been discussed with the patient and family. After consideration of risks, benefits and other options for treatment, the patient has consented to  Procedure(s) with comments: BENTALL PROCEDURE (N/A) - Biopsy of Media Stinal Adenopathy MITRAL VALVE REPAIR (MVR) (N/A) INTRAOPERATIVE TRANSESOPHAGEAL ECHOCARDIOGRAM (N/A) as a surgical intervention .  The patient's history has been reviewed, patient examined, no change in status, stable for surgery.  I have reviewed the patient's chart and labs.  Questions were answered to the patient's satisfaction.     Alleen Borne

## 2012-12-18 NOTE — Anesthesia Preprocedure Evaluation (Signed)
Anesthesia Evaluation  Patient identified by MRN, date of birth, ID band Patient awake    Reviewed: Allergy & Precautions, H&P , NPO status , Patient's Chart, lab work & pertinent test results  Airway       Dental   Pulmonary          Cardiovascular hypertension, + Peripheral Vascular Disease and +CHF + dysrhythmias     Neuro/Psych    GI/Hepatic   Endo/Other    Renal/GU      Musculoskeletal   Abdominal   Peds  Hematology   Anesthesia Other Findings   Reproductive/Obstetrics                           Anesthesia Physical Anesthesia Plan  ASA: IV  Anesthesia Plan: General   Post-op Pain Management:    Induction: Intravenous  Airway Management Planned: Oral ETT  Additional Equipment: Arterial line, CVP, PA Cath and TEE  Intra-op Plan:   Post-operative Plan: Extubation in OR  Informed Consent: I have reviewed the patients History and Physical, chart, labs and discussed the procedure including the risks, benefits and alternatives for the proposed anesthesia with the patient or authorized representative who has indicated his/her understanding and acceptance.     Plan Discussed with: CRNA, Anesthesiologist and Surgeon  Anesthesia Plan Comments:         Anesthesia Quick Evaluation

## 2012-12-18 NOTE — Progress Notes (Signed)
  Echocardiogram Echocardiogram Transesophageal has been performed.  Sean Juarez 12/18/2012, 8:16 AM

## 2012-12-18 NOTE — Preoperative (Signed)
Beta Blockers   Reason not to administer Beta Blockers:Not Applicable 

## 2012-12-18 NOTE — Transfer of Care (Signed)
Immediate Anesthesia Transfer of Care Note  Patient: Sean Juarez  Procedure(s) Performed: Procedure(s) with comments: BENTALL PROCEDURE (N/A) - Biopsy of Media Stinal Adenopathy MITRAL VALVE REPAIR (MVR) (N/A) INTRAOPERATIVE TRANSESOPHAGEAL ECHOCARDIOGRAM (N/A) BIOPSY OF MEDIASTINAL MASS (N/A)  Patient Location: SICU  Anesthesia Type:General  Level of Consciousness: sedated, unresponsive and Patient remains intubated per anesthesia plan  Airway & Oxygen Therapy: Patient remains intubated per anesthesia plan and Patient placed on Ventilator (see vital sign flow sheet for setting)  Post-op Assessment: Report given to PACU RN and Post -op Vital signs reviewed and stable  Post vital signs: Reviewed and stable  Complications: No apparent anesthesia complications

## 2012-12-18 NOTE — Progress Notes (Signed)
Patient experienced a 21 beat run of VTACH, Patient's BP did maintained and his rhythm returned to being atrial paced at 90. Will monitor patient.   Patient is still intubated and was sleeping.

## 2012-12-18 NOTE — Anesthesia Postprocedure Evaluation (Signed)
  Anesthesia Post-op Note  Patient: Sean Juarez  Procedure(s) Performed: Procedure(s) with comments: BENTALL PROCEDURE (N/A) - Biopsy of Media Stinal Adenopathy MITRAL VALVE REPAIR (MVR) (N/A) INTRAOPERATIVE TRANSESOPHAGEAL ECHOCARDIOGRAM (N/A) BIOPSY OF MEDIASTINAL MASS (N/A)  Patient Location: PACU and SICU  Anesthesia Type:General  Level of Consciousness: sedated and Patient remains intubated per anesthesia plan  Airway and Oxygen Therapy: Patient remains intubated per anesthesia plan and Patient placed on Ventilator (see vital sign flow sheet for setting)  Post-op Pain: none  Post-op Assessment: Post-op Vital signs reviewed, Patient's Cardiovascular Status Stable, Respiratory Function Stable, Patent Airway, No signs of Nausea or vomiting and Pain level controlled  Post-op Vital Signs: stable  Complications: No apparent anesthesia complications

## 2012-12-18 NOTE — H&P (Signed)
[address]       [place] [address]             [phone]      Cardiothoracic Surgery History and Physical:   PCP is Ardyth Man, MD  Referring Provider is Rollene Rotunda, MD  Chief Complaint   Patient presents with   .  Aortic Insuffiency     surgical eval, ECHO 11/12/12, CTA Chest 11/03/12   HPI:  The patient is a 42 year old gentleman who recently presented with new onset of lower extremity swelling and increasing dyspnea with exertion. He was noted to have a diastolic murmur and an echocardiogram showed an EF of 50-55% with mild LVH and dilation of the aortic root. He was started on diuretics and lost about 15 lbs of fluid with improvement of his symptoms. A complete echo on 11/12/2012 showed moderate to severe AI with a severely dilated aortic root with a dissection plane seen above the right coronary cusp. There was moderate MR. CTA shows fusiform aneurysmal dilatation of the aortic root with a diameter of 6.3 x 7.3 cm. There is an irregular outpouching with septation along the proximal ascending aortic wall laterally that appears to be a chronic dissection. There is also diffuse mediastinal adenopathy concerning for a lymphoproliferative process.  Past Medical History   Diagnosis  Date   .  Hypertension    .  Obesity    .  Atrial flutter    .  Aortic insufficiency     Past Surgical History   Procedure  Laterality  Date   .  None      Family History   Problem  Relation  Age of Onset   .  Heart attack  Father  59   .  Diabetes  Mother    .  Cancer  Mother    .  Hypertension  Father    .  Cancer  Sister    no family hx of connective tissue disorder.  Social History  History   Substance Use Topics   .  Smoking status:  Never Smoker   .  Smokeless tobacco:  Not on file   .  Alcohol Use:  Yes      Comment: once a month    Current Outpatient Prescriptions   Medication  Sig  Dispense  Refill   .  furosemide (LASIX) 20 MG tablet  Take 1 tablet  by mouth daily.     .  GuaiFENesin (MUCUS RELIEF ADULT PO)  Take 1 tablet by mouth daily.     Marland Kitchen  losartan-hydrochlorothiazide (HYZAAR) 100-12.5 MG per tablet  Take 1 tablet by mouth daily.     .  metoprolol tartrate (LOPRESSOR) 25 MG tablet  Take 1 tablet (25 mg total) by mouth 4 (four) times daily.  180 tablet  3   .  potassium chloride SA (K-DUR,KLOR-CON) 20 MEQ tablet  Take 1 tablet by mouth daily.      No current facility-administered medications for this visit.   No Known Allergies  Review of Systems  Constitutional: Positive for fatigue and unexpected weight change. Negative for fever, chills, diaphoresis, activity change and appetite change.  HENT: Negative.  Eyes: Negative.  Respiratory: Positive for cough and shortness of breath. Negative for chest tightness.  Orthopnea  Cardiovascular: Positive for leg swelling. Negative for chest pain and palpitations.  Gastrointestinal: Negative.  Endocrine: Negative.  Genitourinary: Negative.  Musculoskeletal: Negative.  Allergic/Immunologic: Negative.  Neurological: Negative.  Hematological: Negative.  Psychiatric/Behavioral: Negative.  BP 146/80  Pulse 88  Resp 20  Ht 6\' 3"  (1.905 m)  Wt 237 lb (107.502 kg)  BMI 29.62 kg/m2  SpO2 93%  Physical Exam  Constitutional: He is oriented to person, place, and time. He appears well-nourished. No distress.  HENT:  Head: Normocephalic and atraumatic.  Mouth/Throat: Oropharynx is clear and moist.  Eyes: EOM are normal. Pupils are equal, round, and reactive to light.  Neck: Normal range of motion. Neck supple. No JVD present. No thyromegaly present.  Cardiovascular: Normal rate and regular rhythm.  Murmur heard. 3/6 diastolic murmur over the aorta.  Pulmonary/Chest: Effort normal and breath sounds normal. No respiratory distress. He has no rales.  Abdominal: Soft. He exhibits no distension and no mass. There is no tenderness.  Musculoskeletal: Normal range of motion. He exhibits edema.    mild  Lymphadenopathy:  He has no cervical adenopathy.  Neurological: He is alert and oriented to person, place, and time. No cranial nerve deficit or sensory deficit.  Skin: Skin is warm and dry.  Psychiatric: He has a normal mood and affect.  Diagnostic Tests:  [facility] [address] [place], [place] [address] [phone]  ------------------------------------------------------------ Transthoracic Echocardiography  Patient: Martin, Belling MR #: [id number] Study Date: 11/12/2012 Gender: M Age: 19 Height: 190.5cm Weight: 107.5kg BSA: 2.18m^2 Pt. Status: Room:  ORDERING Hochrein, Fayrene Fearing REFERRING Hochrein, Fayrene Fearing ATTENDING Shirlee Latch, [place] PERFORMING [facility] SONOGRAPHER Junious Dresser, RDCS cc:  ------------------------------------------------------------ LV EF: 50% - 55%  ------------------------------------------------------------ Indications: Ascending aortic aneurysm 441.2  ------------------------------------------------------------ History: PMH: Abnormal CT, Aortic root 6.3 cm x, irregular outpouching with septation. Questionable suspicion for a focal chronic dissection or irregular septated aneurysmal outpouching of sinus of Valsalva. Ascending aortic aneurysm. Abnormal echo. Acquired from the patient and from the patient's chart. Dyspnea and bilateral lower extremity edema. Atrial flutter. Aortic regurgitation. Risk factors: Hypertension.  ------------------------------------------------------------ Study Conclusions  - Left ventricle: Inferobasal and distal septal hypokinesis The cavity size was normal. Wall thickness was increased in a pattern of mild LVH. Systolic function was normal. The estimated ejection fraction was in the range of 50% to 55%. - Aortic valve: Moderate to severe regurgitation. - Aorta: Ascending aorta is severely dilated with disection plane seen above right coronary cusp - Mitral valve: Moderate  regurgitation. - Left atrium: The atrium was mildly dilated. - Atrial septum: No defect or patent foramen ovale was identified. - Pulmonary arteries: PA peak pressure: 57mm Hg (S). Transthoracic echocardiography. M-mode, complete 2D, spectral Doppler, and color Doppler. Height: Height: 190.5cm. Height: 75in. Weight: Weight: 107.5kg. Weight: 236.5lb. Body mass index: BMI: 29.6kg/m^2. Body surface area: BSA: 2.13m^2. Blood pressure: 154/89. Patient status: Outpatient. Location: [facility]  ------------------------------------------------------------  ------------------------------------------------------------ Left ventricle: Inferobasal and distal septal hypokinesis The cavity size was normal. Wall thickness was increased in a pattern of mild LVH. Systolic function was normal. The estimated ejection fraction was in the range of 50% to 55%.  ------------------------------------------------------------ Aortic valve: Doppler: Moderate to severe regurgitation. VTI ratio of LVOT to aortic valve: 0.66. Valve area: 4.07cm^2(VTI). Indexed valve area: 1.72cm^2/m^2 (VTI). Peak velocity ratio of LVOT to aortic valve: 0.82. Valve area: 5.03cm^2 (Vmax). Indexed valve area: 2.13cm^2/m^2 (Vmax). Mean gradient: 6mm Hg (S).  ------------------------------------------------------------ Aorta: Ascending aorta is severely dilated with disection plane seen above right coronary cusp  ------------------------------------------------------------ Mitral valve: Doppler: Moderate regurgitation.  ------------------------------------------------------------ Left atrium: The atrium was mildly dilated.  ------------------------------------------------------------ Atrial septum: No  defect or patent foramen ovale was identified.  ------------------------------------------------------------ Right ventricle: The cavity size was normal. Wall thickness was normal. Systolic function was  normal.  ------------------------------------------------------------ Pulmonic valve: Doppler: Mild regurgitation.  ------------------------------------------------------------ Tricuspid valve: Doppler: Mild regurgitation.  ------------------------------------------------------------ Right atrium: The atrium was normal in size.  ------------------------------------------------------------ Pericardium: The pericardium was normal in appearance.  ------------------------------------------------------------ Post procedure conclusions Ascending Aorta:  - Ascending aorta is severely dilated with disection plane seen above right coronary cusp  ------------------------------------------------------------  2D measurements Normal Doppler measurements Norma Left ventricle l LVID ED, 59 mm 43-52 Main pulmonary artery chord, Pressure 57 mm Hg =30 PLAX , S LVID ES, 42.8 mm 23-38 Left ventricle chord, Ea, lat 13.2 cm/s ----- PLAX ann, FS, chord, 27 % >29 tiss DP PLAX Ea, med 4.5 cm/s ----- LVPW, ED 10.2 mm ------ ann, IVS/LVPW 1.45 <1.3 tiss DP ratio, ED LVOT Vol ED, 189 ml ------ Peak 120 cm/s ----- MOD1 vel, S Vol ES, 104 ml ------ VTI, S 18.9 cm ----- MOD1 Peak 6 mm Hg ----- EF, MOD1 45 % ------ gradient Vol index, 80 ml/m^2 ------ , S ED, MOD1 HR 89 bpm ----- Vol index, 44 ml/m^2 ------ Stroke 116. ml ----- ES, MOD1 vol 4 Vol ED, 196 ml ------ Cardiac 10.4 L/min ----- MOD2 output Vol ES, 105 ml ------ Cardiac 4.4 L/(min-m^ ----- MOD2 index 2) EF, MOD2 46 % ------ Stroke 49.3 ml/m^2 ----- Stroke 91 ml ------ index vol, MOD2 Aortic valve Vol index, 83 ml/m^2 ------ Peak 147 cm/s ----- ED, MOD2 vel, S Vol index, 44 ml/m^2 ------ Mean 113 cm/s ----- ES, MOD2 vel, S Stroke 38.6 ml/m^2 ------ VTI, S 28.6 cm ----- index, Mean 6 mm Hg ----- MOD2 gradient Ventricular septum , S IVS, ED 14.8 mm ------ VTI 0.66 ----- LVOT ratio Diam, S 28 mm ------ LVOT/AV Area 6.16 cm^2  ------ Area, 4.07 cm^2 ----- Diam 28 mm ------ VTI Aorta Area 1.72 cm^2/m^2 ----- Root diam, 41 mm ------ index ED (VTI) AAo AP 65 mm ------ Peak vel 0.82 ----- diam, S ratio, Left atrium LVOT/AV AP dim 52 mm ------ Area, 5.03 cm^2 ----- AP dim 2.2 cm/m^2 <2.2 Vmax index Area 2.13 cm^2/m^2 ----- index (Vmax) Regurg 296 ms ----- PHT Mitral valve Max 530 cm/s ----- regurg vel Regurg 156 cm ----- VTI Tricuspid valve Regurg 359 cm/s ----- peak vel Peak 52 mm Hg ----- RV-RA gradient , S Systemic veins Estimate 5 mm Hg ----- d CVP Right ventricle Pressure 57 mm Hg <30 , S Sa vel, 10.7 cm/s ----- lat ann, tiss DP  ------------------------------------------------------------ Prepared and Electronically Authenticated by  Charlton Haws 2014-07-16T09:[id number]  *RADIOLOGY REPORT*  Clinical Data: Ascending aortic aneurysm, aortic insufficiency,  atrial flutter  CT ANGIOGRAPHY CHEST  Technique: Multidetector CT imaging of the chest using the  standard protocol during bolus administration of intravenous  contrast. Multiplanar reconstructed images including MIPs were  obtained and reviewed to evaluate the vascular anatomy.  Contrast: OMNIPAQUE IOHEXOL 350 MG/ML SOLN  Comparison: None.  Findings: There is fusiform aneurysmal dilatation noted of the  proximal ascending aortic root into the sinus of Valsalva. Aortic  root diameter measures 6.3 cm AP and 7.3 cm transverse, image 54.  Along the proximal ascending aortic wall laterally, there is an  irregular outpouching with septation, images 39-48. This is  suspicious for a focal chronic dissection or irregular septated  aneurysmal outpouching related to the dilated sinus of Valsalva.  No intramural hemorrhage or mediastinal hemorrhage appreciated. No  pericardial effusion. Heart is enlarged diffusely. Bovine arch  anatomy is noted, a normal variant. Major branch vessels remain  patent. Descending thoracic aorta is  normal in caliber.  Diffuse mediastinal adenopathy noted. Superior right paratracheal  lymph node measures 3.3 x 2.7 cm, image 21. Additional enlarged  prevascular, AP window, and precarinal lymph nodes are noted.  These lymph nodes are suspicious for a lymphoproliferative process.  Lung windows demonstrate diffuse patchy ground-glass opacities  throughout the lungs with interlobular septal thickening  bilaterally suspicious for chronic interstitial edema. There is  also a trace right pleural effusion. Suspect mild congestive heart  failure.  Included upper abdomen demonstrates an incidental hypodense  probable cyst in the right hepatic lobe image 116 measuring 10 mm.  Thickening of the left adrenal gland is noted, nonspecific. No  acute finding in the upper abdomen demonstrated.  Mild diffuse thoracic degenerative change with prominent lower  thoracic Schmorl's nodes. No compression fracture.  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  Irregular septated outpouching of the proximal ascending thoracic  aorta along the right lateral wall which could represent a chronic  focal dissection versus chronic irregular distention and dilatation  of the sinus of Valsalva.  No acute intramural hemorrhage, mediastinal hemorrhage, or  pericardial effusion.  Diffuse mediastinal adenopathy, concerning for a  lymphoproliferative process such as lymphoma.  Cardiomegaly with diffuse interstitial edema and small right  effusion compatible with mild CHF  Original Report Authenticated By: Judie Petit. Miles Costain, M.D.  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 There is moderate MR. I agree that surgical repair is indicated due to the high risk of further dissection or rupture as well as progressive congestive heart failure due to severe AI. He will need to have  his coronaries evaluated. He has a strong family history of heart disease. His MR can be evaluated in the OR with TEE.I think cath would be difficult with his large root aneurysm. Cardiac CT would probably give [place] the information we need. He does appear to have some calcified plaque in the proximal LAD on his chest CTA. At 42 years old I have recommended a mechanical valve if his valve is not suitable for repair. I discussed the need for lifelong anticoagulation with coumadin. I discussed the operative procedure with the patient and family including alternatives, benefits and risks; including but not limited to bleeding, blood transfusion, infection, stroke, myocardial infarction, graft failure, heart block requiring a permanent pacemaker, organ dysfunction, and death.    Cyndie Chime, MD Mon Nov 17, 2012 2:09:24 PM EDT       **ADDENDUM** CREATED: 11/17/2012 14:03:22  OVER-READ INTERPRETATION - CT CHEST  The following report is an over-read performed by radiologist Dr.  Aubery Lapping. Kearney Hard, M.D. of [facility], [place] on 11/17/2012  14:03:22. This over-read does not include interpretation of  cardiac or coronary anatomy or pathology. The CTA interpretation  by the cardiologist is attached.  Comparison: Chest 11/03/2012  Findings: Patchy peripheral ground-glass densities are noted within  the lungs which are stable since prior study. This could represent  areas of hypoaeration/atelectasis or mild alveolitis.  Stable ascending thoracic aortic aneurysm and probable focal  dissection proximally. Stable mediastinal adenopathy and mild  cardiomegaly. No acute bony abnormality. Small right pleural  effusions.  IMPRESSION:  Findings are similar to prior study except  for slight enlargement  of a small right pleural effusion. Continued cardiomegaly.  Ascending aortic aneurysm and dissection are stable. Stable  mediastinal adenopathy.  **END ADDENDUM** SIGNED BY: Aubery Lapping. Kearney Hard, M.D.      Study  Result    Cardiac CT:  Indication: Aortic Disection Surgical planning and question need  for bypass grafting  Protocol: Unfortunately the patient was in rapid atrial flutter at  presentation. He also had difficulty laying flat. He was given 15  mg of i.v. lopressor and sublingual nitro. Average flutter rate  during the scan was 70 bpm. He was scanned on a Philips 256  scanner. Given his atrial flutter we chose to scan him  retrospectively at 120 kV The 3D data set was analyzed on a [employer]  work station using MIP, VRT and MPR modes  Findings:  Non-cardiac: Atelectasis and diffuse interstitial lung disease  especially at bases Mediastinal adenopathy  Calcium Score: 102 with one foci of calcification in the proximal  LAD  Coronary Arteries: Right dominant with no anomalies.  LM- normal  LAD- Less than 30% calcified disease proximally. Normal mid and  distal vessel not well seen  D: normal  Circumflex: normal with single OM that comes off LM like an  intermediate branch with small AV groove branch  RCA- large dominant vessel that is normal  Aorta: There is a focal aortic dissection that appears to start  above the RCA and extends along the lateral aortic wall. The arch,  great vessels and descending thoracic aorta are not involvled  Diameter of combined false and true lumen 6.3 cm  Arch diameter 3.3 cm  Descneding Thoracic Aorta 2.7 cm  Impression:  Suboptimal study due to atrial flutter, filling of the false lumen  and aortic insufficiency that made filling the coronary arteries  difficult  1) Calcium scar 102 single area of calcification in proximal LAD  2) Right dominant arteries with no evidence of high grade  disease that would need bypass. Although the study is not ideal I  think it is diagnostic enough to avoid the risk of invasive cath  3) Focal ascending aortic dissection with brisk filling of the  false lumen and combined diameter of 6.3 cm No involvement of  aortic  arch  4) Mediastinal adenopathy see separate report from [facility]  Charlton Haws MD [facility]  Original Report Authenticated By: Charlton Haws, M.D.      Impression:   He will require a Bentall procedure using a mechanical valved graft. I discussed the pros and cons of mechanical and tissue valves with the patient and his family. I have recommended a mechanical valve given his young age. He understands the need for lifelong coumadin and the risks of anticoagulant related bleeding and thromboembolism which can be as high as 1-2 % per year each. He will also require biopsy of the mediastinal adenopathy seen on CT. The etiology of this is unclear but it could be a lymphoproliferative disorder. I don't think there is any way to biopsy this adenopathy without putting him under anesthesia and I think we should fix his aneurysm and AI and then worry about the adenopathy if it requires treatment. He also has some MR by echo that appeared moderate on his last echo and this will require evaluation with TEE in the OR. I discussed the operative procedure with the patient and family including alternatives, benefits and risks; including but not limited to bleeding, blood transfusion, infection, stroke, myocardial infarction, graft failure, heart  block requiring a permanent pacemaker, organ dysfunction, and death. Eden Lathe understands and agrees to proceed.   Plan:   Bentall procedure with a mechanical valve graft, possible mitral valve repair, and biopsy of the mediastinal adenopathy.

## 2012-12-18 NOTE — Op Note (Signed)
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 Median Sternotomy 2. Extracorporeal circulation 3.   Bentall Procedure using a 27 mm St. Jude Mechanical valved graft 4.   Mitral annuloplasty using a 30 mm Sorin 3D Memo ring 5.   Biopsy of anterior mediastinal lymph nodes.  Anesthesia:  General Endotracheal   Clinical History/Surgical Indication:   The patient is a 42 year old gentleman who recently presented with new onset of lower extremity swelling and increasing dyspnea with exertion. He was noted to have a diastolic murmur and an echocardiogram showed an EF of 50-55% with mild LVH and dilation of the aortic root. He was started on diuretics and lost about 15 lbs of fluid with improvement of his symptoms. A complete echo on 11/12/2012 showed moderate to severe AI with a severely dilated aortic root with a dissection plane seen above the right coronary cusp. There was moderate MR. CTA shows fusiform aneurysmal dilatation of the aortic root with a diameter of 6.3 x 7.3 cm. There is an irregular outpouching with septation along the proximal ascending aortic wall laterally that appears to be a chronic dissection. There is also diffuse mediastinal adenopathy concerning for a lymphoproliferative process. A gated cardiac CT was performed to evaluate the coronary circulation due to the large root aneurysm with dissection. This showed no evidence of high grade coronary stenosis. I discussed the pros and cons of mechanical and tissue valves with the patient and his family. I have recommended a mechanical valve given his young age. He understands  the need for lifelong coumadin and the risks of anticoagulant related bleeding and thromboembolism which can be as high as 1-2 % per year each. He will also require biopsy of the mediastinal adenopathy seen on CT. The etiology of this is unclear but it could be a lymphoproliferative disorder. I don't think there is any way to biopsy this adenopathy without putting him under anesthesia and I think we should fix his aneurysm and AI and then worry about the adenopathy if it requires treatment. He also has some MR by echo that appeared moderate on his last echo and this will require evaluation with TEE in the OR. I discussed the operative procedure with the patient and family including alternatives, benefits and risks; including but not limited to bleeding, blood transfusion, infection, stroke, myocardial infarction, graft failure, heart block requiring a permanent pacemaker, organ dysfunction, and death. Eden Lathe understands and agrees to proceed.      Preparation:  The patient was seen in the preoperative holding area and the correct patient, correct operation were confirmed with the patient after reviewing the medical record and catheterization. The consent was signed by me. Preoperative antibiotics were given. A pulmonary arterial line and radial arterial line were placed by the anesthesia team. The patient was taken back to the operating room and positioned supine on the operating room table. After being placed under general endotracheal anesthesia by the anesthesia team a foley catheter was placed. The neck, chest, abdomen, and both legs were prepped with betadine soap and solution and draped in the usual sterile manner. A surgical time-out was taken and the correct patient and  operative procedure were confirmed with the nursing and anesthesia staff.  Pre-bypass TEE:  This was performed by Dr. Laverle Hobby and reviewed by me. There was severe eccentric AI over the anterior mitral leaflet. The  mitral valve leaflets appeared normal with no evidence of prolapse. There appeared to be moderate MR that was felt to be functional due to annular dilatation and LV enlargement. There was severe pulmonary HTN with PAP 60's over 40's before induction of anesthesia. The RV had mild hypokinesis.  Cardiopulmonary Bypass:  A median sternotomy was performed. The pericardium was opened in the midline. Right ventricular function appeared normal. The aortic root was markedly aneurysmal. There were no contraindications to aortic cannulation or cross-clamping. The aneurysm extended to the mid ascending aorta but the distal ascending aorta and arch were normal in caliber. The patient was fully systemically heparinized and the ACT was maintained > 400 sec. The proximal aortic arch was cannulated with a 22 F aortic cannula for arterial inflow. Venous cannulation was performed via bicaval cannulation with a 68F metal-tip right angle cannula in the SVC and a 72F malleable cannula in the IVC.  Aortic occlusion was performed with a single cross-clamp. Systemic cooling to 28 degrees Centigrade and topical cooling of the heart with iced saline were used. Hyperkalemic retrograde cold blood cardioplegia was used to induce diastolic arrest and was then given at about 20 minute intervals throughout the period of arrest to maintain myocardial temperature at or below 10 degrees centigrade. After the aorta was opened intermittent cold blood antegrade cardioplegia was given directly into the coronary ostia using a soft tip cannula at 20 minute intervals. A temperature probe was inserted into the interventricular septum and an insulating pad was placed in the pericardium. CO2 was insufflated into the pericardium throughout the case to minimize intracardiac air.  Mitral Valve Repair:  The left atrium was opened through a vertical incision in the interatrial groove. Exposure was good. Valve inspection showed normal leaflets with normal  subvalvular apparatus. There was no prolapse and the regurgitation appeared to be related to a loss of coaptation from annular enlargement. A series of pledgetted 2-0 Ethibond sutures were placed around the mitral annulus. The anterior leaflet was sized and a 30 mm Sorin 3D Memo annuloplasty ring was chosen ( Cat. No. [id number], S/N [id number]). The sutures were placed through the ring and it was lowered into place. The sutures were tied. The valve was tested with saline with complete distension of the LV and there was no regurgitation. The atrium was closed with 2 layers of continuous 3-0 prolene suture.  Bentall Procedure:  The aortic root and proximal ascending aorta were mobilized from the right pulmonary artery and main PA. It was opened longitudinally and the aorta and valve inspected. There was a chronic dissection in the root aneurysm that appeared to start just above the coronaries and was confined to the aortic root. The valve was trileaflet with extensive fenestrations around the commissures and marked splaying of the commissures. I did not feel that it was suitable for repair. The right and left coronary arteries were removed from the aortic root with a button of aortic wall around the ostia. They were retracted carefully out of the way with stay sutures to prevent rotation. The native valve was excised taking care to remove all particulate debri. The annulus was sized and a 27 mm St. Jude Mechanical Valved Graft was chosen. ( Ref # [id number], Serial # [id number]) A series of pledgetted 2-0 Ethibond horizontal  mattress sutures were placed around the annulus with the pledgets in a sub-annular position. The sutures were placed through a strip of autologous pericardium to reinforce the annulus and then the valve sewing ring. The valve was lowered into place and the sutures at the hinge posts tied first followed by the remaining sutures. The valve seated nicely. The discs moved normally. Small openings were made  in the graft for the coronary anastomoses using a thermal cautery. Then the left and right coronary buttons were anastomosed to the graft in an end to side manner using continuous 5-0 prolene suture. A light coating of CoSeal was applied to each anastomosis for hemostasis. The distal end of the graft was then cut to the appropriate length and anastomosed end to end to the distal ascending aortausing continuous 3-0 prolene suture. CoSeal was applied to seal the needle holes in the grafts. A vent cannula was placed into the graft to remove any air. Deairing maneuvers were performed and the bed placed in trendelenburg position.  Biopsy of mediastinal lymph nodes:  The pericardium between the aorta and SVC posteriorly was opened and there were multiple large lymph nodes present. One of these was removed in entirety and sent to pathology for permanent examination.  Completion:  The patient was rewarmed to 37 degrees Centigrade. Deairing maneuvers were performed and the head placed in Trendelenburg position. The crossclamp was removed with a time of 239 minutes. There was spontaneous return of sinus rhythm. The position of the graft was satisfactory. The vascular anastomoses all appeared hemostatic. Two temporary epicardial pacing wires were placed on the right atrium and two on the right ventricle. The patient was weaned from CPB without difficulty on Milrinone at 0.3 and dopamine at 3. CPB time was 264 minutes. Cardiac output was 8 LPM. Heparin was fully reversed with protamine and the aortic and venous cannulas removed. Hemostasis was achieved. The patient was coagulopathic and received 2 units of platelets for a count of 80K. INR was 1.78 and he received 2 units of FFP.  Despite these blood products he remained coagulopathic and was oozing from all of the raw surfaces. Therefore I decided to give him a half dose of NovoSeven of . This resulted in good hemostasis. Mediastinal drainage tubes were placed.  The sternum was closed with double #6 stainless steel wires. The fascia was closed with continuous # 1 vicryl suture. The subcutaneous tissue was closed with 2-0 vicryl continuous suture. The skin was closed with 3-0 vicryl subcuticular suture. All sponge, needle, and instrument counts were reported correct at the end of the case. Dry sterile dressings were placed over the incisions and around the chest tubes which were connected to pleurevac suction. The patient was then transported to the surgical intensive care unit in critical but stable condition.   Post-bypass TEE:  The mechanical aortic valve prosthesis was functioning normally. There was no MR. LV function was preserved.

## 2012-12-18 NOTE — Brief Op Note (Addendum)
      [address]       [place] [address]             [phone]     12/18/2012  1:43 PM  PATIENT:  Eden Lathe  42 y.o. male  PRE-OPERATIVE DIAGNOSIS: severe aortic insufficiency,aortic root aneurysm with chronic dissection, moderate MR with severe pulmonary hypertension, mediastinal adenopathy  POST-OPERATIVE DIAGNOSIS:  same  PROCEDURE:  Procedure(s): BENTALL PROCEDURE # 27 ST JUDE VALVE CONDUIT MITRAL VALVE REPAIR (MVR)# 30 MEMO 3D RING ANNULOPLASTY INTRAOPERATIVE TRANSESOPHAGEAL ECHOCARDIOGRAM Anterior mediastinal LN biopsy  SURGEON:  Surgeon(s): Alleen Borne, MD  PHYSICIAN ASSISTANT: Gershon Crane PA-C  ANESTHESIA:   general  PATIENT CONDITION:  ICU - intubated and hemodynamically stable.  PRE-OPERATIVE WEIGHT: 106kg  Complications: no known

## 2012-12-19 ENCOUNTER — Inpatient Hospital Stay (HOSPITAL_COMMUNITY): Payer: BC Managed Care – PPO

## 2012-12-19 ENCOUNTER — Encounter (HOSPITAL_COMMUNITY): Payer: Self-pay | Admitting: Surgery

## 2012-12-19 LAB — POCT I-STAT 3, ART BLOOD GAS (G3+)
Acid-Base Excess: 1 mmol/L (ref 0.0–2.0)
Bicarbonate: 25.2 mEq/L — ABNORMAL HIGH (ref 20.0–24.0)
Bicarbonate: 26 mEq/L — ABNORMAL HIGH (ref 20.0–24.0)
O2 Saturation: 94 %
Patient temperature: 36.6
TCO2: 26 mmol/L (ref 0–100)
TCO2: 27 mmol/L (ref 0–100)
pCO2 arterial: 42.3 mmHg (ref 35.0–45.0)
pH, Arterial: 7.455 — ABNORMAL HIGH (ref 7.350–7.450)
pO2, Arterial: 68 mmHg — ABNORMAL LOW (ref 80.0–100.0)
pO2, Arterial: 69 mmHg — ABNORMAL LOW (ref 80.0–100.0)

## 2012-12-19 LAB — CBC
HCT: 36.4 % — ABNORMAL LOW (ref 39.0–52.0)
Hemoglobin: 11.9 g/dL — ABNORMAL LOW (ref 13.0–17.0)
Hemoglobin: 11.3 g/dL — ABNORMAL LOW (ref 13.0–17.0)
MCH: 29.7 pg (ref 26.0–34.0)
MCHC: 33.6 g/dL (ref 30.0–36.0)
MCV: 88.2 fL (ref 78.0–100.0)
RBC: 4.04 MIL/uL — ABNORMAL LOW (ref 4.22–5.81)
RDW: 17.1 % — ABNORMAL HIGH (ref 11.5–15.5)
WBC: 17.2 10*3/uL — ABNORMAL HIGH (ref 4.0–10.5)

## 2012-12-19 LAB — PREPARE FRESH FROZEN PLASMA
Unit division: 0
Unit division: 0

## 2012-12-19 LAB — PREPARE PLATELET PHERESIS
Unit division: 0
Unit division: 0

## 2012-12-19 LAB — POCT I-STAT, CHEM 8
Creatinine, Ser: 1.3 mg/dL (ref 0.50–1.35)
Glucose, Bld: 138 mg/dL — ABNORMAL HIGH (ref 70–99)
HCT: 37 % — ABNORMAL LOW (ref 39.0–52.0)
Hemoglobin: 12.6 g/dL — ABNORMAL LOW (ref 13.0–17.0)
Sodium: 135 mEq/L (ref 135–145)
TCO2: 23 mmol/L (ref 0–100)

## 2012-12-19 LAB — GLUCOSE, CAPILLARY
Glucose-Capillary: 103 mg/dL — ABNORMAL HIGH (ref 70–99)
Glucose-Capillary: 105 mg/dL — ABNORMAL HIGH (ref 70–99)
Glucose-Capillary: 95 mg/dL (ref 70–99)
Glucose-Capillary: 121 mg/dL — ABNORMAL HIGH (ref 70–99)

## 2012-12-19 LAB — BASIC METABOLIC PANEL
BUN: 13 mg/dL (ref 6–23)
CO2: 26 mEq/L (ref 19–32)
Chloride: 104 mEq/L (ref 96–112)
GFR calc Af Amer: >90 mL/min (ref >90)
Potassium: 3.9 mEq/L (ref 3.5–5.1)

## 2012-12-19 LAB — CREATININE, SERUM: Creatinine, Ser: 1.18 mg/dL (ref 0.50–1.35)

## 2012-12-19 MED ORDER — LOSARTAN POTASSIUM 50 MG PO TABS
50.0000 mg | ORAL_TABLET | Freq: Every day | ORAL | Status: DC
  Administered 2012-12-19 – 2012-12-20 (×2): 50 mg via ORAL
  Filled 2012-12-19 (×3): qty 1

## 2012-12-19 MED ORDER — WHITE PETROLATUM GEL
Status: DC | PRN
  Filled 2012-12-19: qty 5

## 2012-12-19 MED ORDER — METOPROLOL TARTRATE 50 MG PO TABS
50.0000 mg | ORAL_TABLET | Freq: Two times a day (BID) | ORAL | Status: DC
  Administered 2012-12-19 – 2012-12-23 (×9): 50 mg via ORAL
  Filled 2012-12-19 (×10): qty 1

## 2012-12-19 MED ORDER — NITROPRUSSIDE SODIUM 25 MG/ML IV SOLN
0.2500 ug/kg/min | INTRAVENOUS | Status: DC
  Administered 2012-12-19: 0.25 ug/kg/min via INTRAVENOUS
  Administered 2012-12-19: 0.8 ug/kg/min via INTRAVENOUS
  Filled 2012-12-19 (×3): qty 2

## 2012-12-19 MED ORDER — INSULIN ASPART 100 UNIT/ML ~~LOC~~ SOLN
0.0000 [IU] | SUBCUTANEOUS | Status: DC
  Administered 2012-12-19: 2 [IU] via SUBCUTANEOUS

## 2012-12-19 MED ORDER — LABETALOL HCL 5 MG/ML IV SOLN: 10.0000 mg | INTRAVENOUS | Status: DC | PRN

## 2012-12-19 MED ORDER — WARFARIN SODIUM 5 MG PO TABS
5.0000 mg | ORAL_TABLET | Freq: Once | ORAL | Status: AC
  Administered 2012-12-19: 5 mg via ORAL
  Filled 2012-12-19: qty 1

## 2012-12-19 MED ORDER — FUROSEMIDE 10 MG/ML IJ SOLN
40.0000 mg | Freq: Once | INTRAMUSCULAR | Status: AC
  Administered 2012-12-19: 40 mg via INTRAVENOUS
  Filled 2012-12-19: qty 4

## 2012-12-19 MED ORDER — METOPROLOL TARTRATE 25 MG/10 ML ORAL SUSPENSION
12.5000 mg | Freq: Two times a day (BID) | ORAL | Status: DC
  Filled 2012-12-19 (×4): qty 5

## 2012-12-19 MED ORDER — WARFARIN - PHYSICIAN DOSING INPATIENT
Freq: Every day | Status: DC
  Administered 2012-12-19 – 2012-12-22 (×2)

## 2012-12-19 MED ORDER — ENOXAPARIN SODIUM 40 MG/0.4ML ~~LOC~~ SOLN
40.0000 mg | Freq: Every day | SUBCUTANEOUS | Status: DC
  Administered 2012-12-19 – 2012-12-22 (×4): 40 mg via SUBCUTANEOUS
  Filled 2012-12-19 (×5): qty 0.4

## 2012-12-19 MED FILL — Heparin Sodium (Porcine) Inj 1000 Unit/ML: INTRAMUSCULAR | Qty: 60 | Status: AC

## 2012-12-19 MED FILL — Sodium Bicarbonate IV Soln 8.4%: INTRAVENOUS | Qty: 50 | Status: AC

## 2012-12-19 MED FILL — Sodium Chloride IV Soln 0.9%: INTRAVENOUS | Qty: 1000 | Status: AC

## 2012-12-19 MED FILL — Sodium Chloride Irrigation Soln 0.9%: Qty: 3000 | Status: AC

## 2012-12-19 MED FILL — Electrolyte-R (PH 7.4) Solution: INTRAVENOUS | Qty: 3000 | Status: AC

## 2012-12-19 MED FILL — Lidocaine HCl IV Inj 20 MG/ML: INTRAVENOUS | Qty: 10 | Status: AC

## 2012-12-19 MED FILL — Mannitol IV Soln 20%: INTRAVENOUS | Qty: 500 | Status: AC

## 2012-12-19 NOTE — Progress Notes (Signed)
1 Day Post-Op Procedure(s) (LRB): BENTALL PROCEDURE (N/A) MITRAL VALVE REPAIR (MVR) (N/A) INTRAOPERATIVE TRANSESOPHAGEAL ECHOCARDIOGRAM (N/A) BIOPSY OF MEDIASTINAL MASS (N/A) Subjective: No complaints  Objective: Vital signs in last 24 hours: Temp:  [94.8 F (34.9 C)-99.1 F (37.3 C)] 97.7 F (36.5 C) (08/22 0815) Pulse Rate:  [79-94] 93 (08/22 0815) Cardiac Rhythm:  [-] Normal sinus rhythm (08/22 0800) Resp:  [12-23] 17 (08/22 0815) BP: (82-147)/(58-85) 147/80 mmHg (08/22 0800) SpO2:  [88 %-100 %] 94 % (08/22 0815) FiO2 (%):  [40 %-100 %] 50 % (08/22 0800) Weight:  [103.874 kg (229 lb)-106.595 kg (235 lb)] 103.874 kg (229 lb) (08/22 0500)  Hemodynamic parameters for last 24 hours: PAP: (35-56)/(19-30) 35/20 mmHg CO:  [3.6 L/min-7.4 L/min] 7.4 L/min CI:  [1.5 L/min/m2-3.1 L/min/m2] 3.1 L/min/m2  Intake/Output from previous day: 08/21 0701 - 08/22 0700 In: 7247.2 [I.V.:3187.2; Blood:3490; NG/GT:60; IV Piggyback:510] Out: [address] [Urine:7245; Blood:2675; Chest Tube:470] Intake/Output this shift: Total I/O In: 118.4 [I.V.:118.4] Out: 75 [Urine:45; Chest Tube:30]  General appearance: alert and cooperative Neurologic: intact Heart: regular rate and rhythm, crisp valve click Lungs: diminished breath sounds bibasilar Extremities: edema mild Wound: dressing dry.  Lab Results:  Recent Labs  12/18/12 2140 12/19/12 0420  WBC 14.6* 17.2*  HGB 12.3* 11.9*  HCT 37.1* 36.4*  PLT 120* 122*   BMET:  Recent Labs  12/18/12 2136 12/18/12 2140 12/19/12 0420  NA 142  --  137  K 4.2  --  3.9  CL 107  --  104  CO2  --   --  26  GLUCOSE 98  --  122*  BUN 12  --  13  CREATININE 0.90 0.72 0.76  CALCIUM  --   --  8.2*    PT/INR:  Recent Labs  12/18/12 1605  LABPROT 11.1*  INR 0.81   ABG    Component Value Date/Time   PHART 7.394 12/19/2012 0116   HCO3 26.0* 12/19/2012 0116   TCO2 27 12/19/2012 0116   ACIDBASEDEF 2.0 12/18/2012 1459   O2SAT 94.0 12/19/2012 0116   CBG  (last 3)   Recent Labs  12/18/12 2348 12/19/12 0359 12/19/12 0751  GLUCAP 97 95 121*   CXR:  Bibasilar atelectasis  ECG: NSR, No acute changes  Assessment/Plan: S/P Procedure(s) (LRB): BENTALL PROCEDURE (N/A) MITRAL VALVE REPAIR (MVR) (N/A) INTRAOPERATIVE TRANSESOPHAGEAL ECHOCARDIOGRAM (N/A) BIOPSY OF MEDIASTINAL MASS (N/A) He is hypertensive and requiring NTG and nipride. Will increase lopressor and resume losartan at 50. Continue diuresis. He has a long hx of severe HTN that may not have been controlled well.  Keep chest tubes in today Start coumadin Mobilize Diuresis Continue foley due to diuresing patient and patient in ICU See progression orders   LOS: 1 day    Denim Start K 12/19/2012

## 2012-12-19 NOTE — Progress Notes (Signed)
Per Dr. Donata Clay, use the Cuff BP and maintain SBP < 130s, wean Nipride drip.

## 2012-12-19 NOTE — Progress Notes (Signed)
T. CTS p.m. Rounds  Patient doing well 1 day postop Bentall with mechanical valve Maintaining sinus rhythm IV nitride 4 blood pressure control P.m. labs satisfactory

## 2012-12-19 NOTE — Progress Notes (Signed)
Utilization review completed. Allyanna Appleman, RN, BSN. 

## 2012-12-19 NOTE — Progress Notes (Signed)
Dr Laneta Simmers updated at bedside with afternoon rounds. Updated weaning nitro gtt currently, nipride gtt remains at 0.8 mcg. Will continue to wean nitro gtt for MAP<90 and proceed to Nipride gtt. Will continue to monitor. Koren Bound

## 2012-12-19 NOTE — Procedures (Signed)
Extubation Procedure Note  Patient Details:   Name: Sean Juarez DOB: 1970-08-04 MRN: [id number]   Airway Documentation:     Evaluation  O2 sats: stable throughout Complications: No apparent complications Patient did tolerate procedure well. Bilateral Breath Sounds: Coarse crackles Suctioning: Airway;Oral Yes   Patient extubated to [facility].  Patient had positive cuff leak and following all commands.  Patient deep oral suctioned and ETT suctioned prior to extubation.  No stridor noted post-extubation.  Patient vocalized post-extubation.  NIF -20 VC 950  Malachi Paradise 12/19/2012, 12:19 AM

## 2012-12-19 NOTE — Progress Notes (Signed)
Patient Oxygen sats dropped into the mid-upper 80s a few times last night, each time patient [facility] was elevated, patient was encouraged to CDB, we worked on his IS (1000-1250) and his sats would return mid 90s.    At 0650, patient sats did drop and even with pulm. toileting they did not improve, switched patient to Venturi Mask at 50%, sats in the low-mid 90s now. Will monitor

## 2012-12-20 ENCOUNTER — Inpatient Hospital Stay (HOSPITAL_COMMUNITY): Payer: BC Managed Care – PPO

## 2012-12-20 LAB — PROTIME-INR
INR: 1.29 (ref 0.00–1.49)
Prothrombin Time: 15.8 seconds — ABNORMAL HIGH (ref 11.6–15.2)

## 2012-12-20 LAB — BASIC METABOLIC PANEL
BUN: 20 mg/dL (ref 6–23)
Creatinine, Ser: 1.1 mg/dL (ref 0.50–1.35)
GFR calc Af Amer: >90 mL/min (ref >90)
GFR calc non Af Amer: 82 mL/min — ABNORMAL LOW (ref >90)
Potassium: 4.3 mEq/L (ref 3.5–5.1)

## 2012-12-20 LAB — CBC
MCHC: 33.4 g/dL (ref 30.0–36.0)
Platelets: 110 10*3/uL — ABNORMAL LOW (ref 150–400)
RDW: 17 % — ABNORMAL HIGH (ref 11.5–15.5)
WBC: 18.7 10*3/uL — ABNORMAL HIGH (ref 4.0–10.5)

## 2012-12-20 LAB — GLUCOSE, CAPILLARY: Glucose-Capillary: 120 mg/dL — ABNORMAL HIGH (ref 70–99)

## 2012-12-20 MED ORDER — BISACODYL 10 MG RE SUPP: 10.0000 mg | Freq: Every day | RECTAL | Status: DC | PRN

## 2012-12-20 MED ORDER — ALUM & MAG HYDROXIDE-SIMETH 200-200-20 MG/5ML PO SUSP: 15.0000 mL | ORAL | Status: DC | PRN

## 2012-12-20 MED ORDER — MAGNESIUM HYDROXIDE 400 MG/5ML PO SUSP: 30.0000 mL | Freq: Every day | ORAL | Status: DC | PRN

## 2012-12-20 MED ORDER — MOVING RIGHT ALONG BOOK
Freq: Once | Status: AC
  Administered 2012-12-20: 16:00:00
  Filled 2012-12-20: qty 1

## 2012-12-20 MED ORDER — FE FUMARATE-B12-VIT C-FA-IFC PO CAPS
1.0000 | ORAL_CAPSULE | Freq: Three times a day (TID) | ORAL | Status: DC
  Administered 2012-12-20 – 2012-12-23 (×10): 1 via ORAL
  Filled 2012-12-20 (×13): qty 1

## 2012-12-20 MED ORDER — BISACODYL 5 MG PO TBEC: 10.0000 mg | DELAYED_RELEASE_TABLET | Freq: Every day | ORAL | Status: DC | PRN

## 2012-12-20 MED ORDER — POTASSIUM CHLORIDE CRYS ER 20 MEQ PO TBCR
20.0000 meq | EXTENDED_RELEASE_TABLET | Freq: Two times a day (BID) | ORAL | Status: DC
  Administered 2012-12-20: 20 meq via ORAL
  Filled 2012-12-20 (×3): qty 1

## 2012-12-20 MED ORDER — TRAMADOL HCL 50 MG PO TABS: 50.0000 mg | ORAL_TABLET | ORAL | Status: DC | PRN

## 2012-12-20 MED ORDER — LABETALOL HCL 5 MG/ML IV SOLN
20.0000 mg | Freq: Once | INTRAVENOUS | Status: AC
  Administered 2012-12-20: 20 mg via INTRAVENOUS
  Filled 2012-12-20: qty 4

## 2012-12-20 MED ORDER — WARFARIN SODIUM 5 MG PO TABS
5.0000 mg | ORAL_TABLET | Freq: Once | ORAL | Status: AC
  Administered 2012-12-20: 5 mg via ORAL
  Filled 2012-12-20: qty 1

## 2012-12-20 MED ORDER — WARFARIN SODIUM 5 MG IV SOLR: 5.0000 mg | Freq: Once | INTRAVENOUS | Status: DC

## 2012-12-20 MED ORDER — SODIUM CHLORIDE 0.9 % IV SOLN: 250.0000 mL | INTRAVENOUS | Status: DC | PRN

## 2012-12-20 MED ORDER — FUROSEMIDE 10 MG/ML IJ SOLN
40.0000 mg | Freq: Two times a day (BID) | INTRAMUSCULAR | Status: DC
  Administered 2012-12-20 – 2012-12-21 (×2): 40 mg via INTRAVENOUS
  Filled 2012-12-20 (×4): qty 4

## 2012-12-20 MED ORDER — HYDRALAZINE HCL 25 MG PO TABS
25.0000 mg | ORAL_TABLET | Freq: Three times a day (TID) | ORAL | Status: DC
  Administered 2012-12-20 – 2012-12-23 (×9): 25 mg via ORAL
  Filled 2012-12-20 (×12): qty 1

## 2012-12-20 MED ORDER — SODIUM CHLORIDE 0.9 % IJ SOLN: 3.0000 mL | INTRAMUSCULAR | Status: DC | PRN

## 2012-12-20 MED ORDER — SODIUM CHLORIDE 0.9 % IJ SOLN
3.0000 mL | Freq: Two times a day (BID) | INTRAMUSCULAR | Status: DC
  Administered 2012-12-21 – 2012-12-23 (×3): 3 mL via INTRAVENOUS

## 2012-12-20 MED ORDER — INSULIN ASPART 100 UNIT/ML ~~LOC~~ SOLN: 0.0000 [IU] | Freq: Three times a day (TID) | SUBCUTANEOUS | Status: DC

## 2012-12-20 MED ORDER — GUAIFENESIN-DM 100-10 MG/5ML PO SYRP: 15.0000 mL | ORAL_SOLUTION | ORAL | Status: DC | PRN

## 2012-12-20 NOTE — Plan of Care (Signed)
Problem: Phase I Progression Outcomes Goal: Pain controlled with appropriate interventions Outcome: Completed/Met Date Met:  12/20/12 Pain managed with PRN medication

## 2012-12-20 NOTE — Progress Notes (Signed)
Dr Donata Clay updated with AM rounds. Updated pt currently on 0.6 mcg nipride gtt, nitro gtt weaned off 8/22. SBP 120-140, MAP 80-90s via cuff. Aline out. MD to order labetalol IV x1 and PO hydralazine and will wean off nipride gtt. MD updated pt only OOB to chair with MT x2 still in place. MD updated pt with Right IJ sleeve and double lumen central line (IJ). Order to take out sleeve (introducer) and leave in double lumen IJ. MD order to leave foley catheter for now. Will continue to monitor. Koren Bound

## 2012-12-20 NOTE — Progress Notes (Signed)
Left Radial A-Line removed, A-line was malpositioned, had a whip, and was not pulling back.  Catheter intact and 8 minutes of pressure was applied.

## 2012-12-20 NOTE — Plan of Care (Signed)
Problem: Phase III Progression Outcomes Goal: Time patient transferred to PCTU/Telemetry POD Outcome: Completed/Met Date Met:  12/20/12 1615

## 2012-12-20 NOTE — Progress Notes (Signed)
2 Days Post-Op Procedure(s) (LRB): BENTALL PROCEDURE (N/A) MITRAL VALVE REPAIR (MVR) (N/A) INTRAOPERATIVE TRANSESOPHAGEAL ECHOCARDIOGRAM (N/A) BIOPSY OF MEDIASTINAL MASS (N/A) Subjective: Status post Bentall procedure and mitral valve ring Stable hemodynamics, hypertension required low-dose nitride overnight Chest x-ray clear Coumadin started for mechanical aVR Pulmonary status stable  Objective: Vital signs in last 24 hours: Temp:  [97.2 F (36.2 C)-98.5 F (36.9 C)] 98.5 F (36.9 C) (08/23 0747) Pulse Rate:  [71-101] 77 (08/23 0830) Cardiac Rhythm:  [-] Normal sinus rhythm (08/23 0800) Resp:  [14-29] 18 (08/23 0830) BP: (110-144)/(70-99) 131/70 mmHg (08/23 0830) SpO2:  [90 %-100 %] 93 % (08/23 0830) FiO2 (%):  [50 %] 50 % (08/22 1400) Weight:  [230 lb (104.327 kg)] 230 lb (104.327 kg) (08/23 0500)  Hemodynamic parameters for last 24 hours: PAP: (44-56)/(26-33) 48/27 mmHg  Intake/Output from previous day: 08/22 0701 - 08/23 0700 In: 1524.6 [I.V.:1474.6; IV Piggyback:50] Out: 2420 [Urine:2190; Chest Tube:230] Intake/Output this shift: Total I/O In: 99.2 [I.V.:99.2] Out: 225 [Urine:225]  Lungs clear Sharp valve closure sound No edema Neuro intact  Lab Results:  Recent Labs  12/19/12 1657 12/20/12 0350  WBC 19.2* 18.7*  HGB 11.3* 11.2*  HCT 33.6* 33.5*  PLT 125* 110*   BMET:  Recent Labs  12/19/12 0420 12/19/12 1649 12/19/12 1657 12/20/12 0350  NA 137 135  --  131*  K 3.9 4.1  --  4.3  CL 104 99  --  97  CO2 26  --   --  25  GLUCOSE 122* 138*  --  117*  BUN 13 17  --  20  CREATININE 0.76 1.30 1.18 1.10  CALCIUM 8.2*  --   --  8.4    PT/INR:  Recent Labs  12/20/12 0350  LABPROT 15.8*  INR 1.29   ABG    Component Value Date/Time   PHART 7.394 12/19/2012 0116   HCO3 26.0* 12/19/2012 0116   TCO2 23 12/19/2012 1649   ACIDBASEDEF 2.0 12/18/2012 1459   O2SAT 94.0 12/19/2012 0116   CBG (last 3)   Recent Labs  12/19/12 1127 12/19/12 1951  12/20/12 0745  GLUCAP 127* 113* 97    Assessment/Plan: S/P Procedure(s) (LRB): BENTALL PROCEDURE (N/A) MITRAL VALVE REPAIR (MVR) (N/A) INTRAOPERATIVE TRANSESOPHAGEAL ECHOCARDIOGRAM (N/A) BIOPSY OF MEDIASTINAL MASS (N/A) Plan for transfer to step-down: see transfer orders Continue Coumadin daily Follow elevated white count-probable stress response Follow blood pressure-continue beta blocker, ARB, diuretic, Apresoline, and when necessary IV labetalol   LOS: 2 days    VAN TRIGT III,Reya Aurich 12/20/2012

## 2012-12-20 NOTE — Progress Notes (Signed)
Pt transferred to 2W25. Pt ambulated to room from 2S. Tolerated well. Pt transported on tele monitor and 4LNC. Pt placed on 2W tele monitor placed on pt on arrival, remains on 4LNC. Receiving RN in room, bedside report given. Pts cell phone and black bookbag transferred with pt. Sean Juarez

## 2012-12-20 NOTE — Plan of Care (Signed)
Problem: Phase III Progression Outcomes Goal: Transfer to PCTU/Telemetry POD Outcome: Completed/Met Date Met:  12/20/12 Transfer to 2W25

## 2012-12-21 ENCOUNTER — Inpatient Hospital Stay (HOSPITAL_COMMUNITY): Payer: BC Managed Care – PPO

## 2012-12-21 LAB — BASIC METABOLIC PANEL
BUN: 19 mg/dL (ref 6–23)
CO2: 28 mEq/L (ref 19–32)
Calcium: 8.7 mg/dL (ref 8.4–10.5)
Chloride: 100 mEq/L (ref 96–112)
Creatinine, Ser: 0.83 mg/dL (ref 0.50–1.35)
GFR calc Af Amer: >90 mL/min (ref >90)
GFR calc non Af Amer: >90 mL/min (ref >90)
Glucose, Bld: 99 mg/dL (ref 70–99)
Potassium: 3.9 mEq/L (ref 3.5–5.1)
Sodium: 133 mEq/L — ABNORMAL LOW (ref 135–145)

## 2012-12-21 LAB — PROTIME-INR
INR: 1.23 (ref 0.00–1.49)
Prothrombin Time: 15.2 seconds (ref 11.6–15.2)

## 2012-12-21 LAB — CBC
HCT: 32.5 % — ABNORMAL LOW (ref 39.0–52.0)
Hemoglobin: 10.7 g/dL — ABNORMAL LOW (ref 13.0–17.0)
MCH: 29.6 pg (ref 26.0–34.0)
MCHC: 32.9 g/dL (ref 30.0–36.0)
MCV: 90 fL (ref 78.0–100.0)
Platelets: 119 10*3/uL — ABNORMAL LOW (ref 150–400)
RBC: 3.61 MIL/uL — ABNORMAL LOW (ref 4.22–5.81)
RDW: 16.9 % — ABNORMAL HIGH (ref 11.5–15.5)
WBC: 15.1 10*3/uL — ABNORMAL HIGH (ref 4.0–10.5)

## 2012-12-21 LAB — GLUCOSE, CAPILLARY: Glucose-Capillary: 79 mg/dL (ref 70–99)

## 2012-12-21 MED ORDER — SODIUM CHLORIDE 0.9 % IJ SOLN: 10.0000 mL | INTRAMUSCULAR | Status: DC | PRN

## 2012-12-21 MED ORDER — POTASSIUM CHLORIDE CRYS ER 20 MEQ PO TBCR: 20.0000 meq | EXTENDED_RELEASE_TABLET | Freq: Every day | ORAL | Status: DC

## 2012-12-21 MED ORDER — FUROSEMIDE 40 MG PO TABS
40.0000 mg | ORAL_TABLET | Freq: Every day | ORAL | Status: DC
  Administered 2012-12-22 – 2012-12-23 (×2): 40 mg via ORAL
  Filled 2012-12-21 (×2): qty 1

## 2012-12-21 MED ORDER — LOSARTAN POTASSIUM 50 MG PO TABS
50.0000 mg | ORAL_TABLET | Freq: Every day | ORAL | Status: DC
  Administered 2012-12-21 – 2012-12-22 (×2): 50 mg via ORAL
  Filled 2012-12-21 (×2): qty 1

## 2012-12-21 MED ORDER — FUROSEMIDE 10 MG/ML IJ SOLN
40.0000 mg | Freq: Once | INTRAMUSCULAR | Status: AC
  Administered 2012-12-21: 40 mg via INTRAVENOUS

## 2012-12-21 MED ORDER — WARFARIN SODIUM 7.5 MG PO TABS
7.5000 mg | ORAL_TABLET | Freq: Every day | ORAL | Status: DC
  Administered 2012-12-21 – 2012-12-22 (×2): 7.5 mg via ORAL
  Filled 2012-12-21 (×3): qty 1

## 2012-12-21 MED ORDER — LOSARTAN POTASSIUM 50 MG PO TABS
100.0000 mg | ORAL_TABLET | Freq: Every day | ORAL | Status: DC
  Filled 2012-12-21: qty 2

## 2012-12-21 MED ORDER — POTASSIUM CHLORIDE CRYS ER 20 MEQ PO TBCR
20.0000 meq | EXTENDED_RELEASE_TABLET | Freq: Every day | ORAL | Status: DC
  Administered 2012-12-21 – 2012-12-23 (×3): 20 meq via ORAL
  Filled 2012-12-21 (×2): qty 1

## 2012-12-21 NOTE — Progress Notes (Addendum)
      [address]       [place] [address]             [phone]        3 Days Post-Op Procedure(s) (LRB): BENTALL PROCEDURE (N/A) MITRAL VALVE REPAIR (MVR) (N/A) INTRAOPERATIVE TRANSESOPHAGEAL ECHOCARDIOGRAM (N/A) BIOPSY OF MEDIASTINAL MASS (N/A)  Subjective: Patient feeling ok, he has no specific complaints this am  Objective: Vital signs in last 24 hours: Temp:  [97.9 F (36.6 C)-99.6 F (37.6 C)] 99.6 F (37.6 C) (08/24 0419) Pulse Rate:  [70-84] 77 (08/24 0419) Cardiac Rhythm:  [-] Normal sinus rhythm (08/24 0833) Resp:  [16-27] 22 (08/24 0419) BP: (81-139)/(68-95) 134/91 mmHg (08/24 0419) SpO2:  [92 %-100 %] 93 % (08/24 0419) Weight:  [104.781 kg (231 lb)] 104.781 kg (231 lb) (08/24 0419)  Pre op weight 106  kg Current Weight  12/21/12 104.781 kg (231 lb)     Intake/Output from previous day: 08/23 0701 - 08/24 0700 In: 885.8 [P.O.:600; I.V.:285.8] Out: 3770 [Urine:3700; Chest Tube:70]   Physical Exam:  Cardiovascular: RRR, sharp valve click, no murmur Pulmonary: Clear to auscultation bilaterally; no rales, wheezes, or rhonchi. Abdomen: Soft, non tender, bowel sounds present. Extremities: Mild bilateral lower extremity edema. Wounds: Clean and dry.  No erythema or signs of infection.  Lab Results: CBC: Recent Labs  12/20/12 0350 12/21/12 0500  WBC 18.7* 15.1*  HGB 11.2* 10.7*  HCT 33.5* 32.5*  PLT 110* 119*   BMET:  Recent Labs  12/20/12 0350 12/21/12 0500  NA 131* 133*  K 4.3 3.9  CL 97 100  CO2 25 28  GLUCOSE 117* 99  BUN 20 19  CREATININE 1.10 0.83  CALCIUM 8.4 8.7    PT/INR:  Lab Results  Component Value Date   INR 1.23 12/21/2012   INR 1.29 12/20/2012   INR 0.81 12/18/2012   ABG:  INR: Will add last result for INR, ABG once components are confirmed Will add last 4 CBG results once components are confirmed  Assessment/Plan:  1. CV - SR. On Hydralazine 25 tid, Cozaar 50 daily,Lopressor 50 bid, and  Coumadin (mechanical valve). INR remains around 1.23 so will increase Coumadin to 7.5 daily. Monitor BP as may need to increase medications for better bp control. 2.  Pulmonary - On 2 liters of oxygen via  [place]. Wean as tolerates. CXR this am shows no pneumothorax, cardiomegaly, pulmonary vascular congestion, bibasilar atelectasis R>L. Encourage incentive spirometer. 3. Volume Overload - On Lasix 40 IV bid. Has had excellent diuresis. Is below pre op weight. He was already give 40 IV this am. Will not give any more Lasix today and will start oral in am. 4.  Acute blood loss anemia - H and H stable at 10.7 and 32.5. Continue Trinsicon 5.Mild thrombocytopenia-platelets up to 119,000. 6.CBGs 120/91/79. Pre op HGA1C 5.5. Will stop glucose checks and SS 7.Remove foley 8.Remove EPW in am  ZIMMERMAN,DONIELLE MPA-C 12/21/2012,9:01 AM patient examined and medical record reviewed,agree with above note. VAN TRIGT III,Lacole Komorowski 12/21/2012

## 2012-12-21 NOTE — Progress Notes (Signed)
Pt ambulated 1000 ft with rolling walker on room air. Pt tolerated well.

## 2012-12-21 NOTE — Progress Notes (Signed)
Pt ambulated 1500 ft with RW, minimal assist, tolerated very well.  No complaints of pain, fatigue, dizzines, or SOB.  To recliner after walk with call bell in reach.  Will con't plan of care.

## 2012-12-22 LAB — CBC
Hemoglobin: 10.2 g/dL — ABNORMAL LOW (ref 13.0–17.0)
MCH: 29.3 pg (ref 26.0–34.0)
MCHC: 32.7 g/dL (ref 30.0–36.0)
Platelets: 153 10*3/uL (ref 150–400)
RBC: 3.48 MIL/uL — ABNORMAL LOW (ref 4.22–5.81)

## 2012-12-22 LAB — GLUCOSE, CAPILLARY: Glucose-Capillary: 102 mg/dL — ABNORMAL HIGH (ref 70–99)

## 2012-12-22 LAB — PROTIME-INR
INR: 1.41 (ref 0.00–1.49)
Prothrombin Time: 16.9 seconds — ABNORMAL HIGH (ref 11.6–15.2)

## 2012-12-22 MED ORDER — LOSARTAN POTASSIUM 50 MG PO TABS
100.0000 mg | ORAL_TABLET | Freq: Every day | ORAL | Status: DC
  Administered 2012-12-23: 100 mg via ORAL
  Filled 2012-12-22: qty 2

## 2012-12-22 NOTE — Progress Notes (Signed)
CARDIAC REHAB PHASE I   PRE:  Rate/Rhythm: 83SR  BP:  Supine:   Sitting: 143/75  Standing:    SaO2: 96%RA  MODE:  Ambulation: 1440 ft   POST:  Rate/Rhythm: 113 ST  BP:  Supine:   Sitting: 145/71  Standing:    SaO2: 93%RA 0805-0848 Pt walked 1440 ft independently with rolling walker with fast, steady gait. Will not need walker for home. Likes to walk. Tolerated well. To recliner after walk.    Luetta Nutting, RN BSN  12/22/2012 8:44 AM

## 2012-12-22 NOTE — Progress Notes (Signed)
EPW and CTS DC'd per MD order and protocol.  Pt tolerated well, atrial site slight bleeding noted, stopped after 5 minutes of pressure.  No ectopy noted, frequent vitals initiated, pt instructed to remain in bed x 1 hr.  Steris applied to CT sites and betadine to all incisions.  Will continue to monitor closely. Ave Filter

## 2012-12-22 NOTE — Progress Notes (Addendum)
       [address]       [place] [address]             [phone]          4 Days Post-Op Procedure(s) (LRB): BENTALL PROCEDURE (N/A) MITRAL VALVE REPAIR (MVR) (N/A) INTRAOPERATIVE TRANSESOPHAGEAL ECHOCARDIOGRAM (N/A) BIOPSY OF MEDIASTINAL MASS (N/A)  Subjective: Feeling well, no complaints.   Objective: Vital signs in last 24 hours: Patient Vitals for the past 24 hrs:  BP Temp Temp src Pulse Resp SpO2 Weight  12/22/12 0433 131/84 mmHg 98.1 F (36.7 C) Oral 79 19 94 % 219 lb 11.2 oz (99.655 kg)  12/21/12 2000 125/76 mmHg 99.3 F (37.4 C) Oral 80 20 92 % -  12/21/12 1416 117/77 mmHg - - - - - -  12/21/12 1336 122/70 mmHg 98.9 F (37.2 C) Oral 79 18 94 % -  12/21/12 1037 135/83 mmHg - - 80 - - -   Current Weight  12/22/12 219 lb 11.2 oz (99.655 kg)  Pre op weight 106 kg    Intake/Output from previous day: 08/24 0701 - 08/25 0700 In: 440 [P.O.:440] Out: 3800 [Urine:3800]    PHYSICAL EXAM:  Heart: RRR, good valve click Lungs: Clear Wound: Clean and dry Extremities: Mild LE edema    Lab Results: CBC: Recent Labs  12/21/12 0500 12/22/12 0430  WBC 15.1* 10.5  HGB 10.7* 10.2*  HCT 32.5* 31.2*  PLT 119* 153   BMET:  Recent Labs  12/20/12 0350 12/21/12 0500  NA 131* 133*  K 4.3 3.9  CL 97 100  CO2 25 28  GLUCOSE 117* 99  BUN 20 19  CREATININE 1.10 0.83  CALCIUM 8.4 8.7    PT/INR:  Recent Labs  12/22/12 0430  LABPROT 16.9*  INR 1.41      Assessment/Plan: S/P Procedure(s) (LRB): BENTALL PROCEDURE (N/A) MITRAL VALVE REPAIR (MVR) (N/A) INTRAOPERATIVE TRANSESOPHAGEAL ECHOCARDIOGRAM (N/A) BIOPSY OF MEDIASTINAL MASS (N/A) CV- SR, BPs improved.  Continue current meds.  INR remains subtherapeutic, but is trending up.  Will give another 7.5 mg tonight and watch. Vol overload- continue diuresis. Leukocytosis- WBC trending down. D/c pacing wires, continue CRPI, pulm toilet.   Hopefully home soon if INR continues toward  therapeutic.   LOS: 4 days    COLLINS,GINA H 12/22/2012   Chart reviewed, patient examined, agree with above. His weight is less than preop although he was in heart failure preop and had significant volume overload. Continue daily lasix.  Will stop aspirin since he is on coumadin and has no significant coronary disease. Increase Losartan to 100mg  as he was on preop. Can stop hydralazine tomorrow. I don't think he will be compliant with a TID medication.

## 2012-12-23 LAB — PROTIME-INR
INR: 1.93 — ABNORMAL HIGH (ref 0.00–1.49)
Prothrombin Time: 21.5 seconds — ABNORMAL HIGH (ref 11.6–15.2)

## 2012-12-23 MED ORDER — POTASSIUM CHLORIDE CRYS ER 20 MEQ PO TBCR: 20.0000 meq | EXTENDED_RELEASE_TABLET | Freq: Every day | ORAL | Status: DC

## 2012-12-23 MED ORDER — WARFARIN SODIUM 5 MG PO TABS: 7.5000 mg | ORAL_TABLET | Freq: Every day | ORAL | Status: DC

## 2012-12-23 MED ORDER — METOPROLOL TARTRATE 50 MG PO TABS: 50.0000 mg | ORAL_TABLET | Freq: Two times a day (BID) | ORAL | Status: DC

## 2012-12-23 MED ORDER — OXYCODONE HCL 5 MG PO TABS: 5.0000 mg | ORAL_TABLET | ORAL | Status: DC | PRN

## 2012-12-23 MED ORDER — FUROSEMIDE 40 MG PO TABS: 40.0000 mg | ORAL_TABLET | Freq: Every day | ORAL | Status: DC

## 2012-12-23 MED ORDER — LOSARTAN POTASSIUM 100 MG PO TABS: 100.0000 mg | ORAL_TABLET | Freq: Every day | ORAL | Status: DC

## 2012-12-23 MED FILL — Heparin Sodium (Porcine) Inj 1000 Unit/ML: INTRAMUSCULAR | Qty: 2500 | Status: AC

## 2012-12-23 MED FILL — Sodium Chloride IV Soln 0.9%: INTRAVENOUS | Qty: 1000 | Status: AC

## 2012-12-23 MED FILL — Heparin Sodium (Porcine) Inj 1000 Unit/ML: INTRAMUSCULAR | Qty: 30 | Status: AC

## 2012-12-23 MED FILL — Potassium Chloride Inj 2 mEq/ML: INTRAVENOUS | Qty: 40 | Status: AC

## 2012-12-23 MED FILL — Magnesium Sulfate Inj 50%: INTRAMUSCULAR | Qty: 10 | Status: AC

## 2012-12-23 NOTE — Care Management Note (Signed)
    Page 1 of 2   12/23/2012     2:17:46 PM   CARE MANAGEMENT NOTE 12/23/2012  Patient:  Sean Juarez, Sean Juarez   Account Number:  [id number]  Date Initiated:  12/23/2012  Documentation initiated by:  Odin Mariani  Subjective/Objective Assessment:   PT S/P AVR ON 12/18/12.  PTA, PT INDEPENDENT, LIVES WITH SPOUSE.     Action/Plan:   PT FOR DC HOME TODAY WITH WIFE.  NEEDS HHRN FOR PT/INR DRAW ON THRUSDAY WITH RESULTS TO [place] CARDS IN [place].   Anticipated DC Date:  12/23/2012   Anticipated DC Plan:  HOME W HOME HEALTH SERVICES      DC Planning Services  CM consult      [facility] Choice  HOME HEALTH   Choice offered to / List presented to:  C-1 Patient        HH arranged  HH-1 RN      [facility] agency  [facility]   Status of service:  Completed, signed off Medicare Important Message given?   (If response is "NO", the following Medicare IM given date fields will be blank) Date Medicare IM given:   Date Additional Medicare IM given:    Discharge Disposition:  HOME W HOME HEALTH SERVICES  Per UR Regulation:  Reviewed for med. necessity/level of care/duration of stay  If discussed at Long Length of Stay Meetings, dates discussed:    Comments:  12/23/12 Temperence Zenor,RN,BSN [phone] PT LIVES IN [place]; REFERRAL TO [facility], PER PT CHOICE.  FAXED REFERRAL TO [facility] AT [phone].  START OF CARE 12/25/12. PHONE # FOR HH AGENCY:  [phone] AND THIS INFORMATION PUT ON PT'S DC INSTRUCTIONS.

## 2012-12-23 NOTE — Progress Notes (Signed)
  Pt was discharged per MD order. Pt was alert and oriented with no complaints of pain. Pt verbalized understanding of discharge teaching and was given prescriptions. Pt was escorted to private vehicle. Ilean Skill, Fortino Haag R, RN

## 2012-12-23 NOTE — Discharge Summary (Signed)
[address]       [place] [address]             [phone]              Discharge Summary  Name: Sean Juarez DOB: [birth year] 42 y.o. 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 Hypertension   . Obesity   . Atrial flutter   . Aortic insufficiency   . CHF (congestive heart failure)      Procedures: BENTALL PROCEDURE (27 mm St. Jude mechanical valve conduit) MITRAL VALVE REPAIR (30 mm Sorin Memo 3D annuloplasty ring) BIOPSY OF ANTERIOR MEDIASTINAL LYMPH NODES - 12/18/2012    HPI:  The patient is a 42 y.o. male who recently presented with new onset of lower extremity swelling and increasing dyspnea with exertion. He was noted to have a diastolic murmur and an echocardiogram showed an EF of 50-55% with mild LVH and dilation of the aortic root. He was started on diuretics and lost about 15 lbs of fluid with improvement of his symptoms. A complete echo on 11/12/2012 showed moderate to severe AI with a severely dilated aortic root with a dissection plane seen above the right coronary cusp. There was moderate MR. CTA shows fusiform aneurysmal dilatation of the aortic root with a diameter of 6.3 x 7.3 cm. There is an irregular outpouching with septation along the proximal ascending aortic wall laterally that appears to be a chronic dissection. There is also diffuse mediastinal adenopathy concerning for a  lymphoproliferative process. He was referred to Dr. Laneta Simmers for cardiac surgical evaluation.  It was felt that he would require a Bentall procedure with a mechanical valve conduit, as well as possible mitral valve intervention and biopsy of the mediastinal adenopathy. All risks, benefits and alternatives of surgery were explained in detail, and the patient agreed to proceed.      Hospital Course:  The patient was admitted to [facility] on 12/18/2012. He was taken to the operating room and underwent the above procedure.    The postoperative course was notable for hypertension, which initially required multiple IV agents for control.  He ultimately was weaned from all drips and converted to oral antihypertensives.  He was also noted early on to have a leukocytosis without fever or signs of infection.  This was thought to be a stress response, was treated conservatively, and has resolved.  He was started on Coumadin for his mechanical valve, and has required doses of 5 and 7.5 mg in order for his INR to bump.  He presently remains slightly subtherapeutic at 1.9, but has been steadily trending up.   He overall has done very well.  He is ambulating in the halls independently without difficulty.  He has remained afebrile and vital signs are stable.  His incisions are healing well, and there is a strong valve click on physical exam. 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  He has been evaluated on today's date and is ready for discharge home.     Recent vital signs:  Filed Vitals:   12/23/12 1052  BP: 140/72  Pulse: 85  Temp:   Resp:     Recent laboratory studies:  CBC: Recent Labs  12/21/12 0500 12/22/12 0430  WBC 15.1* 10.5  HGB 10.7* 10.2*  HCT 32.5* 31.2*  PLT 119* 153   BMET:  Recent Labs  12/21/12 0500  NA 133*  K 3.9  CL 100  CO2 28  GLUCOSE 99  BUN 19  CREATININE 0.83  CALCIUM 8.7    PT/INR:  Recent Labs  12/23/12 0417  LABPROT 21.5*  INR  1.93*      Discharge Medications:     Medication List    STOP taking these medications       losartan-hydrochlorothiazide 100-12.5 MG per tablet  Commonly known as:  HYZAAR     mupirocin ointment 2 %  Commonly known as:  BACTROBAN      TAKE these medications       furosemide 40 MG tablet  Commonly known as:  LASIX  Take 1 tablet (40 mg total) by mouth daily. X 1 week     guaiFENesin 600 MG 12 hr tablet  Commonly known as:  MUCINEX  Take 1,200 mg by mouth 2 (two) times daily as needed for congestion.     losartan 100 MG tablet  Commonly known as:  COZAAR  Take 1 tablet (100 mg total) by mouth daily.     metoprolol 50 MG tablet  Commonly known as:  LOPRESSOR  Take 1 tablet (50 mg total) by mouth 2 (two) times daily.     oxyCODONE 5 MG immediate release tablet  Commonly known as:  Oxy IR/ROXICODONE  Take 1-2 tablets (5-10 mg total) by mouth every 3 (three) hours as needed for pain.     potassium chloride SA 20 MEQ tablet  Commonly known as:  K-DUR,KLOR-CON  Take 1 tablet (20 mEq total) by mouth daily. X 1 week     warfarin 5 MG tablet  Commonly known as:  COUMADIN  Take 1.5 tablets (7.5 mg total) by mouth daily. Or as directed by Coumadin Clinic         Discharge Instructions:  The patient is to refrain from driving, heavy lifting or strenuous activity.  May shower daily and clean incisions with soap and water.  May resume regular diet.    Follow Up:      Discharge Orders   Future Appointments Provider Department Dept Phone   01/14/2013 12:30 PM Alleen Borne, MD [facility] [phone]   Future Orders Complete By Expires   Amb Referral to Cardiac Rehabilitation  As directed       Follow-up Information   Follow up with Alleen Borne, MD. (Sept 17, 2014 at 12:30 to see the surgeon. Please obtain a Chest Xray at 11:30 at [facility] located in the same office complex)    Specialty:  Cardiothoracic  Surgery   Contact information:   [address] [place] [address] [phone]       Follow up with Rollene Rotunda, MD. Schedule an appointment as soon as possible for a visit in 2 weeks.   Specialty:  Cardiology   Contact information:   [address] [place] [address] [phone]       Follow up with [facility] Clan (near [place]) On 12/25/2012. (Home health RN will draw bloodwork for Coumadin (PT/INR) and call results to Dr. Jenene Slicker office)  Specialty:  Cardiology   Contact information:   [address] [place] [address] [phone]       Adella Hare 12/23/2012, 11:53 AM

## 2012-12-23 NOTE — Progress Notes (Addendum)
       [address]       [place] [address]             [phone]          5 Days Post-Op Procedure(s) (LRB): BENTALL PROCEDURE (N/A) MITRAL VALVE REPAIR (MVR) (N/A) INTRAOPERATIVE TRANSESOPHAGEAL ECHOCARDIOGRAM (N/A) BIOPSY OF MEDIASTINAL MASS (N/A)  Subjective: Comfortable, no complaints.   Objective: Vital signs in last 24 hours: Patient Vitals for the past 24 hrs:  BP Temp Temp src Pulse Resp SpO2 Weight  12/23/12 0513 135/74 mmHg 98.5 F (36.9 C) Oral 75 20 98 % 222 lb (100.699 kg)  12/22/12 2056 137/73 mmHg 99 F (37.2 C) Oral 87 18 95 % -  12/22/12 1400 130/90 mmHg 98.8 F (37.1 C) Oral 77 18 99 % -   Current Weight  12/23/12 222 lb (100.699 kg)  Pre op weight 106 kg    Intake/Output from previous day: 08/25 0701 - 08/26 0700 In: 480 [P.O.:480] Out: 1101 [Urine:1100; Stool:1]    PHYSICAL EXAM:  Heart: RRR, good valve click Lungs: Clear Wound: Clean and dry Extremities: Mild LE edema    Lab Results: CBC: Recent Labs  12/21/12 0500 12/22/12 0430  WBC 15.1* 10.5  HGB 10.7* 10.2*  HCT 32.5* 31.2*  PLT 119* 153   BMET:  Recent Labs  12/21/12 0500  NA 133*  K 3.9  CL 100  CO2 28  GLUCOSE 99  BUN 19  CREATININE 0.83  CALCIUM 8.7    PT/INR:  Recent Labs  12/23/12 0417  LABPROT 21.5*  INR 1.93*      Assessment/Plan: S/P Procedure(s) (LRB): BENTALL PROCEDURE (N/A) MITRAL VALVE REPAIR (MVR) (N/A) INTRAOPERATIVE TRANSESOPHAGEAL ECHOCARDIOGRAM (N/A) BIOPSY OF MEDIASTINAL MASS (N/A) CV- stable, SR.  Will d/c Hydralazine since Losartan was increased.   Vol overload- continue diuresis. INR trending up. Continue anticoagulation for mechanical valve. Will arrange [facility] to assist with INR draws. Possibly home later today vs in am.    LOS: 5 days    COLLINS,GINA H 12/23/2012   Chart reviewed, patient examined, agree with above. INR is 1.9. Will send him home today. Will send home on 7.5 mg coumadin with  INR check this Thursday.

## 2012-12-23 NOTE — Progress Notes (Signed)
CARDIAC REHAB PHASE I   PRE:  Rate/Rhythm: 86SR  BP:  Supine:   Sitting:   Standing: 160/90   SaO2: 99%RA  MODE:  Ambulation: 890 ft   POST:  Rate/Rhythm: 87  BP:  Supine:   Sitting: 160/80  Standing:    SaO2: 97%RA 0902-0945 Pt walked 890 ft with steady gait. Did not need walker. Education completed. Referring to [facility] Phase 2 with pt's permission. Put on discharge video for pt to view. Encouraged low sodium diet and watching dark green leafy vegs. Pt on Coumadin.    Luetta Nutting, RN BSN  12/23/2012 9:40 AM

## 2012-12-25 ENCOUNTER — Telehealth: Payer: Self-pay | Admitting: Cardiology

## 2012-12-25 NOTE — Telephone Encounter (Signed)
Pt can not have coumadin checked at home because he is able to drive and [employer] requires their home health pts to be home bound pt needs coumadin appt. Scheduled appt for Friday at 8-29 2:00

## 2012-12-26 ENCOUNTER — Ambulatory Visit (INDEPENDENT_AMBULATORY_CARE_PROVIDER_SITE_OTHER): Payer: BC Managed Care – PPO | Admitting: *Deleted

## 2012-12-26 DIAGNOSIS — I7100 Dissection of unspecified site of aorta: Secondary | ICD-10-CM

## 2012-12-26 DIAGNOSIS — I351 Nonrheumatic aortic (valve) insufficiency: Secondary | ICD-10-CM

## 2012-12-26 DIAGNOSIS — Z954 Presence of other heart-valve replacement: Secondary | ICD-10-CM | POA: Insufficient documentation

## 2012-12-26 DIAGNOSIS — I4892 Unspecified atrial flutter: Secondary | ICD-10-CM

## 2012-12-26 DIAGNOSIS — I359 Nonrheumatic aortic valve disorder, unspecified: Secondary | ICD-10-CM

## 2012-12-26 DIAGNOSIS — Z952 Presence of prosthetic heart valve: Secondary | ICD-10-CM | POA: Insufficient documentation

## 2012-12-26 DIAGNOSIS — Z7901 Long term (current) use of anticoagulants: Secondary | ICD-10-CM

## 2012-12-26 LAB — POCT INR: INR: 2.1

## 2012-12-26 NOTE — Progress Notes (Signed)

## 2013-01-02 ENCOUNTER — Ambulatory Visit (INDEPENDENT_AMBULATORY_CARE_PROVIDER_SITE_OTHER): Payer: BC Managed Care – PPO | Admitting: *Deleted

## 2013-01-02 DIAGNOSIS — Z954 Presence of other heart-valve replacement: Secondary | ICD-10-CM

## 2013-01-02 DIAGNOSIS — I4892 Unspecified atrial flutter: Secondary | ICD-10-CM

## 2013-01-02 DIAGNOSIS — I359 Nonrheumatic aortic valve disorder, unspecified: Secondary | ICD-10-CM

## 2013-01-02 DIAGNOSIS — Z952 Presence of prosthetic heart valve: Secondary | ICD-10-CM

## 2013-01-02 DIAGNOSIS — Z7901 Long term (current) use of anticoagulants: Secondary | ICD-10-CM

## 2013-01-02 DIAGNOSIS — I351 Nonrheumatic aortic (valve) insufficiency: Secondary | ICD-10-CM

## 2013-01-02 DIAGNOSIS — I7100 Dissection of unspecified site of aorta: Secondary | ICD-10-CM

## 2013-01-02 LAB — POCT INR: INR: 2

## 2013-01-09 ENCOUNTER — Ambulatory Visit (INDEPENDENT_AMBULATORY_CARE_PROVIDER_SITE_OTHER): Payer: BC Managed Care – PPO | Admitting: *Deleted

## 2013-01-09 DIAGNOSIS — I351 Nonrheumatic aortic (valve) insufficiency: Secondary | ICD-10-CM

## 2013-01-09 DIAGNOSIS — I7100 Dissection of unspecified site of aorta: Secondary | ICD-10-CM

## 2013-01-09 DIAGNOSIS — Z952 Presence of prosthetic heart valve: Secondary | ICD-10-CM

## 2013-01-09 DIAGNOSIS — Z7901 Long term (current) use of anticoagulants: Secondary | ICD-10-CM

## 2013-01-09 DIAGNOSIS — Z954 Presence of other heart-valve replacement: Secondary | ICD-10-CM

## 2013-01-09 DIAGNOSIS — I359 Nonrheumatic aortic valve disorder, unspecified: Secondary | ICD-10-CM

## 2013-01-09 DIAGNOSIS — I4892 Unspecified atrial flutter: Secondary | ICD-10-CM

## 2013-01-09 LAB — POCT INR: INR: 2

## 2013-01-13 ENCOUNTER — Other Ambulatory Visit: Payer: Self-pay | Admitting: *Deleted

## 2013-01-13 DIAGNOSIS — I359 Nonrheumatic aortic valve disorder, unspecified: Secondary | ICD-10-CM

## 2013-01-13 DIAGNOSIS — I719 Aortic aneurysm of unspecified site, without rupture: Secondary | ICD-10-CM

## 2013-01-14 ENCOUNTER — Ambulatory Visit
Admission: RE | Admit: 2013-01-14 | Discharge: 2013-01-14 | Disposition: A | Discharge status: Home / Self Care | Payer: BC Managed Care – PPO | Source: Ambulatory Visit | Attending: Surgery | Admitting: Surgery

## 2013-01-14 ENCOUNTER — Ambulatory Visit (INDEPENDENT_AMBULATORY_CARE_PROVIDER_SITE_OTHER): Payer: Self-pay | Admitting: Surgery

## 2013-01-14 ENCOUNTER — Encounter: Payer: Self-pay | Admitting: Surgery

## 2013-01-14 ENCOUNTER — Ambulatory Visit: Payer: BC Managed Care – PPO | Admitting: Surgery

## 2013-01-14 VITALS — BP 175/97 | HR 65 | Resp 20 | Ht 75.0 in | Wt 228.0 lb

## 2013-01-14 DIAGNOSIS — I719 Aortic aneurysm of unspecified site, without rupture: Secondary | ICD-10-CM

## 2013-01-14 DIAGNOSIS — R599 Enlarged lymph nodes, unspecified: Secondary | ICD-10-CM

## 2013-01-14 DIAGNOSIS — Z9889 Other specified postprocedural states: Secondary | ICD-10-CM

## 2013-01-14 DIAGNOSIS — I351 Nonrheumatic aortic (valve) insufficiency: Secondary | ICD-10-CM

## 2013-01-14 DIAGNOSIS — I359 Nonrheumatic aortic valve disorder, unspecified: Secondary | ICD-10-CM

## 2013-01-14 DIAGNOSIS — R590 Localized enlarged lymph nodes: Secondary | ICD-10-CM

## 2013-01-14 DIAGNOSIS — R222 Localized swelling, mass and lump, trunk: Secondary | ICD-10-CM

## 2013-01-14 DIAGNOSIS — I059 Rheumatic mitral valve disease, unspecified: Secondary | ICD-10-CM

## 2013-01-14 DIAGNOSIS — I7121 Aneurysm of the ascending aorta, without rupture: Secondary | ICD-10-CM

## 2013-01-14 DIAGNOSIS — Z09 Encounter for follow-up examination after completed treatment for conditions other than malignant neoplasm: Secondary | ICD-10-CM

## 2013-01-14 DIAGNOSIS — I340 Nonrheumatic mitral (valve) insufficiency: Secondary | ICD-10-CM

## 2013-01-14 DIAGNOSIS — J9859 Other diseases of mediastinum, not elsewhere classified: Secondary | ICD-10-CM

## 2013-01-14 NOTE — Progress Notes (Signed)
[address]       [place] [address]             [phone]        HPI:  Patient returns for routine postoperative follow-up having undergone a Bentall procedure using a 27 mm St. Jude mechanical valve graft, mitral annuloplasty with a 30 mm ring and biopsy of anterior mediastinal lymphadenopathy on 12/18/2012. The patient's early postoperative recovery while in the hospital was notable for an uncomplicated postoperative course. 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 I removed several large lymph nodes for examination so I think this is accurate. Since hospital discharge the patient reports that he has been feeling well. He denies any chest pain or shortness of breath. He has had no significant peripheral edema. His energy level is improving.   Current Outpatient Prescriptions  Medication Sig Dispense Refill  . guaiFENesin (MUCINEX) 600 MG 12 hr tablet Take 1,200 mg by mouth 2 (two) times daily as needed for congestion.      Marland Kitchen losartan (COZAAR) 100 MG tablet Take 1 tablet (100 mg total) by mouth daily.  30 tablet  1  . metoprolol tartrate (LOPRESSOR) 50 MG tablet Take 1 tablet (50 mg total) by mouth 2 (two) times daily.  60 tablet  1  . oxyCODONE (OXY IR/ROXICODONE) 5 MG immediate release tablet Take 1-2 tablets (5-10 mg total) by mouth every 3 (three) hours as needed for pain.  30 tablet  0  . warfarin (COUMADIN) 5 MG tablet Take 1.5 tablets (7.5 mg total) by mouth daily. Or as directed by Coumadin Clinic  60 tablet  2   No current facility-administered medications for this visit.    Physical Exam: BP 175/97  Pulse 65  Resp 20  Ht 6\' 3"  (1.905 m)  Wt 228 lb (103.42 kg)  BMI 28.5 kg/m2  SpO2 98% He looks well. Cardiac exam shows a regular rate and rhythm with crisp mechanical valve click. There is no murmur. Lung exam is clear. The chest incision is healing well and sternum is  stable. There is mild bilateral lower leg edema. There is a small subcutaneous mass in the right anterior abdominal wall that he asked me to check. This is a little more firm than a lipoma but is nontender and completely mobile. He said this has been present for several years and has not changed in size.  Diagnostic Tests:  *RADIOLOGY REPORT*   Clinical Data: History of aortic valve replacement, follow-up   CHEST - 2 VIEW   Comparison: Chest x-ray of 12/21/2012   Findings: Aeration of the lungs has improved.  Basilar atelectasis and possible small effusions have resolved.  Cardiomegaly is stable.  There is perhaps minimal pulmonary vascular congestion present.  An aortic valve replacement remains.   IMPRESSION: Improved aeration with improvement in basilar atelectasis and resolution of tiny effusions.  Stable cardiomegaly.  Question mild pulmonary vascular congestion.     Original Report Authenticated By: Dwyane Dee, M.D.          Impression:  Overall I think he is doing quite well following his surgery. I told him he could return to driving a car but asked him not to lift anything heavier than 10 pounds for 3 months postoperatively. His INR is being followed by the [facility] anticoagulation clinic and a still fluctuating. He said that he is currently taking 10 mg per day and his INR on  01/09/2013 was 2.0. He is hypertensive today but he said that he has been taking his medications as directed. He has a followup appointment with Dr. Antoine Poche in the near future and I will let him adjust his medications further. I don't think he requires surgical removal of this subcutaneous mass in the right anterior abdominal wall. The patient said this has been present for several years and unchanged. I told him that if it becomes painful or increases in size then it should be removed. I told him I would be happy to do that for him if desired.  Plan:  He will follow up with Dr. Antoine Poche and will  return to see me if he develops any problems with his incisions.

## 2013-01-16 ENCOUNTER — Ambulatory Visit (INDEPENDENT_AMBULATORY_CARE_PROVIDER_SITE_OTHER): Payer: BC Managed Care – PPO | Admitting: *Deleted

## 2013-01-16 DIAGNOSIS — Z952 Presence of prosthetic heart valve: Secondary | ICD-10-CM

## 2013-01-16 DIAGNOSIS — I4892 Unspecified atrial flutter: Secondary | ICD-10-CM

## 2013-01-16 DIAGNOSIS — Z7901 Long term (current) use of anticoagulants: Secondary | ICD-10-CM

## 2013-01-16 DIAGNOSIS — I7100 Dissection of unspecified site of aorta: Secondary | ICD-10-CM

## 2013-01-16 DIAGNOSIS — I359 Nonrheumatic aortic valve disorder, unspecified: Secondary | ICD-10-CM

## 2013-01-16 DIAGNOSIS — I351 Nonrheumatic aortic (valve) insufficiency: Secondary | ICD-10-CM

## 2013-01-16 DIAGNOSIS — Z954 Presence of other heart-valve replacement: Secondary | ICD-10-CM

## 2013-01-27 DIAGNOSIS — Z0279 Encounter for issue of other medical certificate: Secondary | ICD-10-CM

## 2013-01-30 ENCOUNTER — Ambulatory Visit (INDEPENDENT_AMBULATORY_CARE_PROVIDER_SITE_OTHER): Payer: BC Managed Care – PPO | Admitting: *Deleted

## 2013-01-30 DIAGNOSIS — I359 Nonrheumatic aortic valve disorder, unspecified: Secondary | ICD-10-CM

## 2013-01-30 DIAGNOSIS — I4892 Unspecified atrial flutter: Secondary | ICD-10-CM

## 2013-01-30 DIAGNOSIS — I7100 Dissection of unspecified site of aorta: Secondary | ICD-10-CM

## 2013-01-30 DIAGNOSIS — I351 Nonrheumatic aortic (valve) insufficiency: Secondary | ICD-10-CM

## 2013-01-30 DIAGNOSIS — Z7901 Long term (current) use of anticoagulants: Secondary | ICD-10-CM

## 2013-01-30 DIAGNOSIS — Z952 Presence of prosthetic heart valve: Secondary | ICD-10-CM

## 2013-01-30 DIAGNOSIS — Z954 Presence of other heart-valve replacement: Secondary | ICD-10-CM

## 2013-02-17 ENCOUNTER — Ambulatory Visit (INDEPENDENT_AMBULATORY_CARE_PROVIDER_SITE_OTHER): Payer: BC Managed Care – PPO | Admitting: *Deleted

## 2013-02-17 DIAGNOSIS — Z7901 Long term (current) use of anticoagulants: Secondary | ICD-10-CM

## 2013-02-17 DIAGNOSIS — I351 Nonrheumatic aortic (valve) insufficiency: Secondary | ICD-10-CM

## 2013-02-17 DIAGNOSIS — I7100 Dissection of unspecified site of aorta: Secondary | ICD-10-CM

## 2013-02-17 DIAGNOSIS — Z954 Presence of other heart-valve replacement: Secondary | ICD-10-CM

## 2013-02-17 DIAGNOSIS — I359 Nonrheumatic aortic valve disorder, unspecified: Secondary | ICD-10-CM

## 2013-02-17 DIAGNOSIS — Z952 Presence of prosthetic heart valve: Secondary | ICD-10-CM

## 2013-02-17 DIAGNOSIS — I4892 Unspecified atrial flutter: Secondary | ICD-10-CM

## 2013-02-17 LAB — POCT INR: INR: 3

## 2013-02-18 ENCOUNTER — Encounter: Payer: Self-pay | Admitting: Cardiology

## 2013-02-18 ENCOUNTER — Ambulatory Visit (INDEPENDENT_AMBULATORY_CARE_PROVIDER_SITE_OTHER): Payer: BC Managed Care – PPO | Admitting: Cardiology

## 2013-02-18 VITALS — BP 166/106 | HR 73 | Ht 75.0 in | Wt 238.0 lb

## 2013-02-18 DIAGNOSIS — Z952 Presence of prosthetic heart valve: Secondary | ICD-10-CM

## 2013-02-18 DIAGNOSIS — Z954 Presence of other heart-valve replacement: Secondary | ICD-10-CM

## 2013-02-18 DIAGNOSIS — I7100 Dissection of unspecified site of aorta: Secondary | ICD-10-CM

## 2013-02-18 MED ORDER — AMLODIPINE BESYLATE 2.5 MG PO TABS: 2.5000 mg | ORAL_TABLET | Freq: Every day | ORAL | Status: DC

## 2013-02-18 MED ORDER — OXYCODONE HCL 5 MG PO TABS: 5.0000 mg | ORAL_TABLET | Freq: Two times a day (BID) | ORAL | Status: DC | PRN

## 2013-02-18 NOTE — Progress Notes (Signed)
HPI The patient presents for evaluation of lower extremity edema and an abnormal echocardiogram.  At our first appointment he obviously had severe aortic insufficiency. An echo demonstrated chronic ascending aortic dissection.  He also had moderate mitral regurgitation. He underwent a Bentall with mechanical aortic valve and mitral valve repair. Of note he did have atrial flutter as well. He actually has done quite well with surgery. He has some chest soreness still. He's doing some walking and he's not having any new cardiovascular symptoms such as dyspnea, PND or orthopnea. He is not noticing any palpitations, presyncope or syncope. In fact his breathing is much improved compared with previous.  No Known Allergies  Current Outpatient Prescriptions  Medication Sig Dispense Refill  . losartan (COZAAR) 100 MG tablet Take 1 tablet (100 mg total) by mouth daily.  30 tablet  1  . metoprolol tartrate (LOPRESSOR) 50 MG tablet Take 1 tablet (50 mg total) by mouth 2 (two) times daily.  60 tablet  1  . warfarin (COUMADIN) 5 MG tablet Take 1.5 tablets (7.5 mg total) by mouth daily. Or as directed by Coumadin Clinic  60 tablet  2   No current facility-administered medications for this visit.    Past Medical History  Diagnosis Date  . Hypertension   . Obesity   . Atrial flutter   . Aortic insufficiency   . CHF (congestive heart failure)     Past Surgical History  Procedure Laterality Date  . None    . No past surgeries    . Bentall procedure N/A 12/18/2012    Procedure: BENTALL PROCEDURE;  Surgeon: Alleen Borne, MD;  Location[facility] OR;  Service: Open Heart Surgery;  Laterality: N/A;  Biopsy of Media Stinal Adenopathy  . Mitral valve repair N/A 12/18/2012    Procedure: MITRAL VALVE REPAIR (MVR);  Surgeon: Alleen Borne, MD;  Location: [facility] OR;  Service: Open Heart Surgery;  Laterality: N/A;  . Intraoperative transesophageal echocardiogram N/A 12/18/2012    Procedure: INTRAOPERATIVE TRANSESOPHAGEAL  ECHOCARDIOGRAM;  Surgeon: Alleen Borne, MD;  Location: [facility] OR;  Service: Open Heart Surgery;  Laterality: N/A;  . Biopsy of mediastinal mass N/A 12/18/2012    Procedure: BIOPSY OF MEDIASTINAL MASS;  Surgeon: Alleen Borne, MD;  Location: [facility] OR;  Service: Open Heart Surgery;  Laterality: N/A;    ROS:  Positive for weight loss. Otherwise as stated in the history of present illness and negative for all other systems.  PHYSICAL EXAM BP 166/106  Pulse 73  Ht 6\' 3"  (1.905 m)  Wt 238 lb (107.956 kg)  BMI 29.75 kg/m2 GENERAL:  Well appearing HEENT:  Pupils equal round and reactive, fundi not visualized, oral mucosa unremarkable NECK:  No jugular venous distention, waveform within normal limits, carotid upstroke brisk and symmetric, no bruits, no thyromegaly LYMPHATICS:  No cervical, inguinal adenopathy LUNGS:  Clear to auscultation bilaterally BACK:  No CVA tenderness CHEST: Well healed sternotomy scar. HEART:  PMI not displaced or sustained, normal S1 mechanical S2 within normal limits, no S3, no S4, no clicks, no rubs, Brief systolic murmur heard at the apex  ABD:  Flat, positive bowel sounds normal in frequency in pitch, no bruits, no rebound, no guarding, no midline pulsatile mass, no hepatomegaly, no splenomegaly EXT:  2 (bounding) plus pulses throughout, mild bilateral lower extremity edema, no cyanosis no clubbing SKIN:  No rashes no nodules NEURO:  Cranial nerves II through XII grossly intact, motor grossly intact throughout PSYCH:  Cognitively intact, oriented  to person place and time   ASSESSMENT AND PLAN  BENTALL:  He is doing well with this. I will followup with an echocardiogram probably at the next visit. I will give him a limited supply pain medicines as he still having some sternal discomfort.  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 It was apparently a structurally normal valve.  MV REPAIR:   As above.  AF:  He has had no symptomatic  recurrence of this. No further evaluation or change in therapy is indicated.  HTN:   His blood pressure is not well controlled. I will let Norvasc 2.5 mg daily.  ADENOPATHY:  I reviewed this pathology which demonstrated lymphoid hyperplasia no malignancy.

## 2013-02-18 NOTE — Patient Instructions (Signed)
Please start Amlodipine 2.5 mg a day Continue all other medications as listed.  Follow up in 3 months with Dr Antoine Poche in [place].  You will receive a letter in the mail 2 months before you are due.  Please call us when you receive this letter to schedule your follow up appointment.

## 2013-02-25 ENCOUNTER — Other Ambulatory Visit: Payer: Self-pay | Admitting: Cardiology

## 2013-02-25 MED ORDER — LOSARTAN POTASSIUM 100 MG PO TABS: 100.0000 mg | ORAL_TABLET | Freq: Every day | ORAL | Status: DC

## 2013-02-25 MED ORDER — METOPROLOL TARTRATE 50 MG PO TABS: 50.0000 mg | ORAL_TABLET | Freq: Two times a day (BID) | ORAL | Status: DC

## 2013-03-12 ENCOUNTER — Other Ambulatory Visit: Payer: Self-pay

## 2013-03-12 DIAGNOSIS — G8918 Other acute postprocedural pain: Secondary | ICD-10-CM

## 2013-03-12 MED ORDER — OXYCODONE HCL 5 MG PO TABS: 5.0000 mg | ORAL_TABLET | Freq: Two times a day (BID) | ORAL | Status: DC | PRN

## 2013-03-12 NOTE — Telephone Encounter (Signed)
RX for Oxycodone 5 mg # 40/ no refill printed out for pt to pick up at front desk

## 2013-03-20 ENCOUNTER — Ambulatory Visit (INDEPENDENT_AMBULATORY_CARE_PROVIDER_SITE_OTHER): Payer: BC Managed Care – PPO | Admitting: *Deleted

## 2013-03-20 DIAGNOSIS — I351 Nonrheumatic aortic (valve) insufficiency: Secondary | ICD-10-CM

## 2013-03-20 DIAGNOSIS — I7100 Dissection of unspecified site of aorta: Secondary | ICD-10-CM

## 2013-03-20 DIAGNOSIS — Z952 Presence of prosthetic heart valve: Secondary | ICD-10-CM

## 2013-03-20 DIAGNOSIS — Z7901 Long term (current) use of anticoagulants: Secondary | ICD-10-CM

## 2013-03-20 DIAGNOSIS — Z954 Presence of other heart-valve replacement: Secondary | ICD-10-CM

## 2013-03-20 DIAGNOSIS — I359 Nonrheumatic aortic valve disorder, unspecified: Secondary | ICD-10-CM

## 2013-03-20 DIAGNOSIS — I4892 Unspecified atrial flutter: Secondary | ICD-10-CM

## 2013-03-31 ENCOUNTER — Other Ambulatory Visit: Payer: Self-pay | Admitting: Cardiology

## 2013-03-31 MED ORDER — WARFARIN SODIUM 5 MG PO TABS: 7.5000 mg | ORAL_TABLET | Freq: Every day | ORAL | Status: DC

## 2013-04-02 ENCOUNTER — Other Ambulatory Visit: Payer: Self-pay | Admitting: Cardiology

## 2013-04-02 MED ORDER — WARFARIN SODIUM 5 MG PO TABS: 7.5000 mg | ORAL_TABLET | Freq: Every day | ORAL | Status: DC

## 2013-05-01 ENCOUNTER — Ambulatory Visit (INDEPENDENT_AMBULATORY_CARE_PROVIDER_SITE_OTHER): Payer: BC Managed Care – PPO | Admitting: *Deleted

## 2013-05-01 DIAGNOSIS — I4892 Unspecified atrial flutter: Secondary | ICD-10-CM

## 2013-05-01 DIAGNOSIS — I7100 Dissection of unspecified site of aorta: Secondary | ICD-10-CM

## 2013-05-01 DIAGNOSIS — I359 Nonrheumatic aortic valve disorder, unspecified: Secondary | ICD-10-CM

## 2013-05-01 DIAGNOSIS — Z954 Presence of other heart-valve replacement: Secondary | ICD-10-CM

## 2013-05-01 DIAGNOSIS — I351 Nonrheumatic aortic (valve) insufficiency: Secondary | ICD-10-CM

## 2013-05-01 DIAGNOSIS — I719 Aortic aneurysm of unspecified site, without rupture: Secondary | ICD-10-CM

## 2013-05-01 DIAGNOSIS — Z7901 Long term (current) use of anticoagulants: Secondary | ICD-10-CM

## 2013-05-01 DIAGNOSIS — Z952 Presence of prosthetic heart valve: Secondary | ICD-10-CM

## 2013-05-01 LAB — POCT INR: INR: 3.1

## 2013-06-12 ENCOUNTER — Ambulatory Visit (INDEPENDENT_AMBULATORY_CARE_PROVIDER_SITE_OTHER): Payer: BC Managed Care – PPO | Admitting: *Deleted

## 2013-06-12 DIAGNOSIS — I719 Aortic aneurysm of unspecified site, without rupture: Secondary | ICD-10-CM

## 2013-06-12 DIAGNOSIS — Z5181 Encounter for therapeutic drug level monitoring: Secondary | ICD-10-CM

## 2013-06-12 DIAGNOSIS — I351 Nonrheumatic aortic (valve) insufficiency: Secondary | ICD-10-CM

## 2013-06-12 DIAGNOSIS — I7100 Dissection of unspecified site of aorta: Secondary | ICD-10-CM

## 2013-06-12 DIAGNOSIS — Z952 Presence of prosthetic heart valve: Secondary | ICD-10-CM

## 2013-06-12 DIAGNOSIS — I4892 Unspecified atrial flutter: Secondary | ICD-10-CM

## 2013-06-12 DIAGNOSIS — Z7901 Long term (current) use of anticoagulants: Secondary | ICD-10-CM

## 2013-06-12 DIAGNOSIS — I359 Nonrheumatic aortic valve disorder, unspecified: Secondary | ICD-10-CM

## 2013-06-12 DIAGNOSIS — Z954 Presence of other heart-valve replacement: Secondary | ICD-10-CM

## 2013-06-12 LAB — POCT INR: INR: 2.5

## 2013-07-15 ENCOUNTER — Ambulatory Visit (INDEPENDENT_AMBULATORY_CARE_PROVIDER_SITE_OTHER): Payer: BC Managed Care – PPO | Admitting: Cardiology

## 2013-07-15 ENCOUNTER — Encounter: Payer: Self-pay | Admitting: Cardiology

## 2013-07-15 VITALS — BP 161/110 | HR 63 | Ht 75.0 in | Wt 266.0 lb

## 2013-07-15 DIAGNOSIS — I4892 Unspecified atrial flutter: Secondary | ICD-10-CM

## 2013-07-15 DIAGNOSIS — I351 Nonrheumatic aortic (valve) insufficiency: Secondary | ICD-10-CM

## 2013-07-15 DIAGNOSIS — Z952 Presence of prosthetic heart valve: Secondary | ICD-10-CM

## 2013-07-15 DIAGNOSIS — Z954 Presence of other heart-valve replacement: Secondary | ICD-10-CM

## 2013-07-15 DIAGNOSIS — I359 Nonrheumatic aortic valve disorder, unspecified: Secondary | ICD-10-CM

## 2013-07-15 MED ORDER — AMLODIPINE BESYLATE 5 MG PO TABS: 5.0000 mg | ORAL_TABLET | Freq: Every evening | ORAL | Status: DC

## 2013-07-15 NOTE — Progress Notes (Signed)
HPI The patient presents for follow up of ascending aortic dissection and moderate mitral regurgitation status post Bentall with mechanical aortic valve and mitral valve repair. Of note he did have atrial flutter as well.   He continues to do well. He is walking daily. He denies any ongoing chest discomfort, neck or arm discomfort. He's not new shortness of breath, PND or orthopnea. He has some rare palpitations but no sustained arrhythmias. His weight has come back up since she was little and lost weight. He said his previous baseline.  No Known Allergies  Current Outpatient Prescriptions  Medication Sig Dispense Refill  . amLODipine (NORVASC) 2.5 MG tablet Take 1 tablet (2.5 mg total) by mouth daily.  30 tablet  11  . losartan (COZAAR) 100 MG tablet Take 1 tablet (100 mg total) by mouth daily.  30 tablet  6  . metoprolol (LOPRESSOR) 50 MG tablet Take 1 tablet (50 mg total) by mouth 2 (two) times daily.  60 tablet  6  . warfarin (COUMADIN) 5 MG tablet Take 1.5 tablets (7.5 mg total) by mouth daily. Or as directed by [facility]  60 tablet  2  . oxyCODONE (OXY IR/ROXICODONE) 5 MG immediate release tablet Take 1 tablet (5 mg total) by mouth 2 (two) times daily as needed.  40 tablet  0   No current facility-administered medications for this visit.    Past Medical History  Diagnosis Date  . Hypertension   . Obesity   . Atrial flutter   . Aortic insufficiency   . CHF (congestive heart failure)     Past Surgical History  Procedure Laterality Date  . None    . No past surgeries    . Bentall procedure N/A 12/18/2012    Procedure: BENTALL PROCEDURE;  Surgeon: Alleen BorneBryan K Bartle, MD;  [facility];  Service: Open Heart Surgery;  Laterality: N/A;  Biopsy of Media Stinal Adenopathy  . Mitral valve repair N/A 12/18/2012    Procedure: MITRAL VALVE REPAIR (MVR);  Surgeon: Alleen BorneBryan K Bartle, MD;  [facility];  Service: Open Heart Surgery;  Laterality: N/A;  . Intraoperative transesophageal  echocardiogram N/A 12/18/2012    Procedure: INTRAOPERATIVE TRANSESOPHAGEAL ECHOCARDIOGRAM;  Surgeon: Alleen BorneBryan K Bartle, MD;  [facility];  Service: Open Heart Surgery;  Laterality: N/A;  . Biopsy of mediastinal mass N/A 12/18/2012    Procedure: BIOPSY OF MEDIASTINAL MASS;  Surgeon: Alleen BorneBryan K Bartle, MD;  [facility];  Service: Open Heart Surgery;  Laterality: N/A;    ROS:  Positive for dizziness. Otherwise as stated in the history of present illness and negative for all other systems.  PHYSICAL EXAM BP 161/110  Pulse 63  Ht 6\' 3"  (1.905 m)  Wt 266 lb (120.657 kg)  BMI 33.25 kg/m2 GENERAL:  Well appearing NECK:  No jugular venous distention, waveform within normal limits, carotid upstroke brisk and symmetric, no bruits, no thyromegaly LUNGS:  Clear to auscultation bilaterally BACK:  No CVA tenderness CHEST: Well healed sternotomy scar. HEART:  PMI not displaced or sustained, normal S1 mechanical S2 within normal limits, no S3, no S4, no clicks, no rubs, Brief systolic murmur heard at the apex  ABD:  Flat, positive bowel sounds normal in frequency in pitch, no bruits, no rebound, no guarding, no midline pulsatile mass, no hepatomegaly, no splenomegaly EXT:  2 (bounding) plus pulses throughout, mild bilateral lower extremity edema, no cyanosis no clubbing   EKG:  Sinus rhythm, rate 64, left axis deviation, left atrial enlargement,  poor anterior R wave progression, no acute ST-T wave changes. 07/15/2013   ASSESSMENT AND PLAN  BENTALL:  He is doing well with this. I will check an echocardiogram for follow up.    MV REPAIR:   As above.  AF:  He has had no symptomatic recurrence of this. No further evaluation or change in therapy is indicated.  HTN:   His blood pressure is not well controlled. I will increase the Norvasc to 5 mg daily.  OBESITY:  The patient understands the need to lose weight with diet and exercise. We have discussed specific strategies for this.

## 2013-07-15 NOTE — Patient Instructions (Signed)
Please increase Amlodipine to 5 mg at bedtime. Continue all other medications as listed.  Your physician has requested that you have an echocardiogram. Echocardiography is a painless test that uses sound waves to create images of your heart. It provides your doctor with information about the size and shape of your heart and how well your heart's chambers and valves are working. This procedure takes approximately one hour. There are no restrictions for this procedure.  Follow up in 4 months with Dr Antoine PocheHochrein.  You will receive a letter in the mail 2 months before you are due.  Please call us when you receive this letter to schedule your follow up appointment.

## 2013-07-24 ENCOUNTER — Ambulatory Visit (INDEPENDENT_AMBULATORY_CARE_PROVIDER_SITE_OTHER): Payer: BC Managed Care – PPO | Admitting: *Deleted

## 2013-07-24 DIAGNOSIS — I4892 Unspecified atrial flutter: Secondary | ICD-10-CM

## 2013-07-24 DIAGNOSIS — Z5181 Encounter for therapeutic drug level monitoring: Secondary | ICD-10-CM

## 2013-07-24 DIAGNOSIS — I7100 Dissection of unspecified site of aorta: Secondary | ICD-10-CM

## 2013-07-24 DIAGNOSIS — I359 Nonrheumatic aortic valve disorder, unspecified: Secondary | ICD-10-CM

## 2013-07-24 DIAGNOSIS — Z7901 Long term (current) use of anticoagulants: Secondary | ICD-10-CM

## 2013-07-24 DIAGNOSIS — Z954 Presence of other heart-valve replacement: Secondary | ICD-10-CM

## 2013-07-24 DIAGNOSIS — I351 Nonrheumatic aortic (valve) insufficiency: Secondary | ICD-10-CM

## 2013-07-24 DIAGNOSIS — Z952 Presence of prosthetic heart valve: Secondary | ICD-10-CM

## 2013-07-24 LAB — POCT INR: INR: 2.8

## 2013-07-29 ENCOUNTER — Other Ambulatory Visit (INDEPENDENT_AMBULATORY_CARE_PROVIDER_SITE_OTHER): Payer: BC Managed Care – PPO

## 2013-07-29 ENCOUNTER — Other Ambulatory Visit: Payer: Self-pay

## 2013-07-29 DIAGNOSIS — I351 Nonrheumatic aortic (valve) insufficiency: Secondary | ICD-10-CM

## 2013-07-29 DIAGNOSIS — I359 Nonrheumatic aortic valve disorder, unspecified: Secondary | ICD-10-CM

## 2013-07-29 DIAGNOSIS — I4892 Unspecified atrial flutter: Secondary | ICD-10-CM

## 2013-07-29 DIAGNOSIS — Z952 Presence of prosthetic heart valve: Secondary | ICD-10-CM

## 2013-09-04 ENCOUNTER — Ambulatory Visit (INDEPENDENT_AMBULATORY_CARE_PROVIDER_SITE_OTHER): Payer: BC Managed Care – PPO | Admitting: *Deleted

## 2013-09-04 DIAGNOSIS — Z7901 Long term (current) use of anticoagulants: Secondary | ICD-10-CM

## 2013-09-04 DIAGNOSIS — Z952 Presence of prosthetic heart valve: Secondary | ICD-10-CM

## 2013-09-04 DIAGNOSIS — I351 Nonrheumatic aortic (valve) insufficiency: Secondary | ICD-10-CM

## 2013-09-04 DIAGNOSIS — I359 Nonrheumatic aortic valve disorder, unspecified: Secondary | ICD-10-CM

## 2013-09-04 DIAGNOSIS — Z5181 Encounter for therapeutic drug level monitoring: Secondary | ICD-10-CM

## 2013-09-04 DIAGNOSIS — Z954 Presence of other heart-valve replacement: Secondary | ICD-10-CM

## 2013-09-04 DIAGNOSIS — I4892 Unspecified atrial flutter: Secondary | ICD-10-CM

## 2013-09-04 DIAGNOSIS — I719 Aortic aneurysm of unspecified site, without rupture: Secondary | ICD-10-CM

## 2013-09-04 DIAGNOSIS — I7100 Dissection of unspecified site of aorta: Secondary | ICD-10-CM

## 2013-09-04 LAB — POCT INR: INR: 2

## 2013-11-02 ENCOUNTER — Other Ambulatory Visit: Payer: Self-pay | Admitting: *Deleted

## 2013-11-02 MED ORDER — LOSARTAN POTASSIUM 100 MG PO TABS
100.0000 mg | ORAL_TABLET | Freq: Every day | ORAL | Status: DC
Start: 2013-11-02 — End: 2014-09-22

## 2013-11-02 MED ORDER — WARFARIN SODIUM 5 MG PO TABS: 7.5000 mg | ORAL_TABLET | Freq: Every day | ORAL | Status: DC

## 2014-01-15 ENCOUNTER — Ambulatory Visit (INDEPENDENT_AMBULATORY_CARE_PROVIDER_SITE_OTHER): Payer: BC Managed Care – PPO | Admitting: *Deleted

## 2014-01-15 DIAGNOSIS — Z952 Presence of prosthetic heart valve: Secondary | ICD-10-CM

## 2014-01-15 DIAGNOSIS — Z5181 Encounter for therapeutic drug level monitoring: Secondary | ICD-10-CM

## 2014-01-15 DIAGNOSIS — Z954 Presence of other heart-valve replacement: Secondary | ICD-10-CM

## 2014-01-15 DIAGNOSIS — I4892 Unspecified atrial flutter: Secondary | ICD-10-CM

## 2014-01-15 LAB — POCT INR: INR: 2.3

## 2014-01-29 ENCOUNTER — Ambulatory Visit (INDEPENDENT_AMBULATORY_CARE_PROVIDER_SITE_OTHER): Payer: BC Managed Care – PPO | Admitting: *Deleted

## 2014-01-29 DIAGNOSIS — Z954 Presence of other heart-valve replacement: Secondary | ICD-10-CM

## 2014-01-29 DIAGNOSIS — Z5181 Encounter for therapeutic drug level monitoring: Secondary | ICD-10-CM

## 2014-01-29 DIAGNOSIS — I4892 Unspecified atrial flutter: Secondary | ICD-10-CM

## 2014-01-29 DIAGNOSIS — Z952 Presence of prosthetic heart valve: Secondary | ICD-10-CM

## 2014-01-29 LAB — POCT INR: INR: 3

## 2014-01-29 MED ORDER — WARFARIN SODIUM 5 MG PO TABS: ORAL_TABLET | ORAL | Status: DC

## 2014-03-02 ENCOUNTER — Ambulatory Visit (INDEPENDENT_AMBULATORY_CARE_PROVIDER_SITE_OTHER): Payer: BC Managed Care – PPO | Admitting: *Deleted

## 2014-03-02 DIAGNOSIS — Z5181 Encounter for therapeutic drug level monitoring: Secondary | ICD-10-CM

## 2014-03-02 DIAGNOSIS — Z954 Presence of other heart-valve replacement: Secondary | ICD-10-CM

## 2014-03-02 DIAGNOSIS — Z952 Presence of prosthetic heart valve: Secondary | ICD-10-CM

## 2014-03-02 DIAGNOSIS — I4892 Unspecified atrial flutter: Secondary | ICD-10-CM

## 2014-03-02 LAB — POCT INR: INR: 2.3

## 2014-03-30 ENCOUNTER — Ambulatory Visit (INDEPENDENT_AMBULATORY_CARE_PROVIDER_SITE_OTHER): Payer: BC Managed Care – PPO | Admitting: *Deleted

## 2014-03-30 DIAGNOSIS — Z954 Presence of other heart-valve replacement: Secondary | ICD-10-CM

## 2014-03-30 DIAGNOSIS — I4892 Unspecified atrial flutter: Secondary | ICD-10-CM

## 2014-03-30 DIAGNOSIS — Z5181 Encounter for therapeutic drug level monitoring: Secondary | ICD-10-CM

## 2014-03-30 DIAGNOSIS — Z952 Presence of prosthetic heart valve: Secondary | ICD-10-CM

## 2014-03-30 LAB — POCT INR: INR: 2.5

## 2014-04-07 ENCOUNTER — Encounter: Payer: Self-pay | Admitting: Cardiology

## 2014-04-07 ENCOUNTER — Ambulatory Visit (INDEPENDENT_AMBULATORY_CARE_PROVIDER_SITE_OTHER): Payer: BC Managed Care – PPO | Admitting: Cardiology

## 2014-04-07 VITALS — BP 200/100 | HR 67 | Ht 75.0 in | Wt 280.0 lb

## 2014-04-07 DIAGNOSIS — I10 Essential (primary) hypertension: Secondary | ICD-10-CM | POA: Insufficient documentation

## 2014-04-07 MED ORDER — AMLODIPINE BESYLATE 10 MG PO TABS: 10.0000 mg | ORAL_TABLET | Freq: Every evening | ORAL | Status: DC

## 2014-04-07 NOTE — Patient Instructions (Signed)
Please increase Amlodipine (Norvasc) to 10 mg a day. Continue all other medications as listed.  Follow up in 2 months with Dr [place]ochrein [place].

## 2014-04-07 NOTE — Progress Notes (Signed)
HPI The patient presents for follow up of ascending aortic dissection and moderate mitral regurgitation status post Bentall with mechanical aortic valve and mitral valve repair. Of note he did have atrial flutter as well.   At the last visit his pressure was elevated. I increased his Norvasc.  He feels OK.  However, his blood pressure is still elevated.he has gained quite a bit of weight even though he says he's trying to cut back.  The patient denies any new symptoms such as chest discomfort, neck or arm discomfort. There has been no new shortness of breath, PND or orthopnea. There have been no reported palpitations, presyncope or syncope.  No Known Allergies  Current Outpatient Prescriptions  Medication Sig Dispense Refill  . amLODipine (NORVASC) 5 MG tablet Take 1 tablet (5 mg total) by mouth every evening. 30 tablet 11  . losartan (COZAAR) 100 MG tablet Take 1 tablet (100 mg total) by mouth daily. 30 tablet 6  . metoprolol (LOPRESSOR) 50 MG tablet Take 1 tablet (50 mg total) by mouth 2 (two) times daily. 60 tablet 6  . oxyCODONE (OXY IR/ROXICODONE) 5 MG immediate release tablet Take 1 tablet (5 mg total) by mouth 2 (two) times daily as needed. 40 tablet 0  . warfarin (COUMADIN) 5 MG tablet Take 2 tablets daily except 3 tablets on Tuesdays and Saturdays or as directed by [facility] 210 tablet 3   No current facility-administered medications for this visit.    Past Medical History  Diagnosis Date  . Hypertension   . Obesity   . Atrial flutter   . Aortic insufficiency   . CHF (congestive heart failure)     Past Surgical History  Procedure Laterality Date  . None    . No past surgeries    . Bentall procedure N/A 12/18/2012    Procedure: BENTALL PROCEDURE;  Surgeon: Alleen BorneBryan K Bartle, MD;  [facility];  Service: Open Heart Surgery;  Laterality: N/A;  Biopsy of Media Stinal Adenopathy  . Mitral valve repair N/A 12/18/2012    Procedure: MITRAL VALVE REPAIR (MVR);  Surgeon: Alleen BorneBryan  K Bartle, MD;  [facility];  Service: Open Heart Surgery;  Laterality: N/A;  . Intraoperative transesophageal echocardiogram N/A 12/18/2012    Procedure: INTRAOPERATIVE TRANSESOPHAGEAL ECHOCARDIOGRAM;  Surgeon: Alleen BorneBryan K Bartle, MD;  [facility];  Service: Open Heart Surgery;  Laterality: N/A;  . Biopsy of mediastinal mass N/A 12/18/2012    Procedure: BIOPSY OF MEDIASTINAL MASS;  Surgeon: Alleen BorneBryan K Bartle, MD;  [facility];  Service: Open Heart Surgery;  Laterality: N/A;    ROS:  Positive for dizziness. Otherwise as stated in the history of present illness and negative for all other systems.  PHYSICAL EXAM BP 200/100 mmHg  Pulse 67  Ht 6\' 3"  (1.905 m)  Wt 280 lb (127.007 kg)  BMI 35.00 kg/m2 GENERAL:  Well appearing NECK:  No jugular venous distention, waveform within normal limits, carotid upstroke brisk and symmetric, no bruits, no thyromegaly LUNGS:  Clear to auscultation bilaterally BACK:  No CVA tenderness CHEST: Well healed sternotomy scar. HEART:  PMI not displaced or sustained, normal S1 mechanical S2 within normal limits, no S3, no S4, no clicks, no rubs, Brief systolic murmur heard at the apex  ABD:  Flat, positive bowel sounds normal in frequency in pitch, no bruits, no rebound, no guarding, no midline pulsatile mass, no hepatomegaly, no splenomegaly EXT:  2 (bounding) plus pulses throughout, mild bilateral lower extremity edema, no cyanosis no clubbing  EKG:  Sinus rhythm, rate 67, left axis deviation, left atrial enlargement, poor anterior R wave progression, no acute ST-T wave changes. 04/07/2014   ASSESSMENT AND PLAN  BENTALL:  This is stable on recent followup echo. No further imaging is indicated at this point.  MV REPAIR:   As above.  HTN:  His BP is not controlled.  I will increase her Norvasc to 10 mg daily.  OBESITY:  The patient understands the need to lose weight with diet and exercise. We have discussed specific strategies for this.

## 2014-04-12 ENCOUNTER — Other Ambulatory Visit: Payer: Self-pay | Admitting: Cardiology

## 2014-05-11 ENCOUNTER — Ambulatory Visit (INDEPENDENT_AMBULATORY_CARE_PROVIDER_SITE_OTHER): Payer: BLUE CROSS/BLUE SHIELD | Admitting: *Deleted

## 2014-05-11 DIAGNOSIS — I4892 Unspecified atrial flutter: Secondary | ICD-10-CM

## 2014-05-11 DIAGNOSIS — Z952 Presence of prosthetic heart valve: Secondary | ICD-10-CM

## 2014-05-11 DIAGNOSIS — Z5181 Encounter for therapeutic drug level monitoring: Secondary | ICD-10-CM

## 2014-05-11 DIAGNOSIS — Z954 Presence of other heart-valve replacement: Secondary | ICD-10-CM

## 2014-05-11 LAB — POCT INR: INR: 3.1

## 2014-05-13 ENCOUNTER — Other Ambulatory Visit: Payer: Self-pay | Admitting: *Deleted

## 2014-05-13 MED ORDER — METOPROLOL TARTRATE 50 MG PO TABS: 50.0000 mg | ORAL_TABLET | Freq: Two times a day (BID) | ORAL | Status: DC

## 2014-05-25 ENCOUNTER — Telehealth: Payer: Self-pay | Admitting: Cardiology

## 2014-05-25 ENCOUNTER — Other Ambulatory Visit: Payer: Self-pay | Admitting: *Deleted

## 2014-05-25 MED ORDER — METOPROLOL TARTRATE 50 MG PO TABS: 50.0000 mg | ORAL_TABLET | Freq: Two times a day (BID) | ORAL | Status: DC

## 2014-05-25 NOTE — Telephone Encounter (Signed)
New Message        Pt calling stating that his rx that he was suppose to receive for Metroproprol 50mg   has yet to be faxed to his pharmacy. Please call pt back and advise.

## 2014-05-25 NOTE — Telephone Encounter (Signed)
Refill of metoprolol done again 05-26-2014

## 2014-06-09 ENCOUNTER — Ambulatory Visit: Payer: BC Managed Care – PPO | Admitting: Cardiology

## 2014-06-16 ENCOUNTER — Encounter: Payer: Self-pay | Admitting: *Deleted

## 2014-07-14 ENCOUNTER — Encounter: Payer: Self-pay | Admitting: Cardiology

## 2014-07-14 ENCOUNTER — Ambulatory Visit (INDEPENDENT_AMBULATORY_CARE_PROVIDER_SITE_OTHER): Payer: BLUE CROSS/BLUE SHIELD | Admitting: Cardiology

## 2014-07-14 VITALS — BP 178/106 | HR 71 | Ht 76.0 in | Wt 269.0 lb

## 2014-07-14 DIAGNOSIS — I10 Essential (primary) hypertension: Secondary | ICD-10-CM | POA: Diagnosis not present

## 2014-07-14 MED ORDER — SPIRONOLACTONE 25 MG PO TABS: 25.0000 mg | ORAL_TABLET | Freq: Every day | ORAL | Status: DC

## 2014-07-14 NOTE — Progress Notes (Signed)
HPI The patient presents for follow up of ascending aortic dissection and moderate mitral regurgitation status post Bentall with mechanical aortic valve and mitral valve repair. Of note he did have atrial flutter as well.   At the last visit his pressure was elevated. I increased his Norvasc.  He feels OK.  However, his blood pressure is still elevated.  He has lost some weight.   The patient denies any new symptoms such as chest discomfort, neck or arm discomfort. There has been no new shortness of breath, PND or orthopnea. There have been no reported palpitations, presyncope or syncope.  He is active but not exercising.   No Known Allergies  Current Outpatient Prescriptions  Medication Sig Dispense Refill  . amLODipine (NORVASC) 10 MG tablet Take 1 tablet (10 mg total) by mouth every evening. 30 tablet 6  . losartan (COZAAR) 100 MG tablet Take 1 tablet (100 mg total) by mouth daily. 30 tablet 6  . metoprolol (LOPRESSOR) 50 MG tablet Take 1 tablet (50 mg total) by mouth 2 (two) times daily. 60 tablet 6  . warfarin (COUMADIN) 5 MG tablet Take 2 tablets daily except 3 tablets on Tuesdays and Saturdays or as directed by [facility] 210 tablet 3  . oxyCODONE (OXY IR/ROXICODONE) 5 MG immediate release tablet Take 1 tablet (5 mg total) by mouth 2 (two) times daily as needed. (Patient not taking: Reported on 07/14/2014) 40 tablet 0   No current facility-administered medications for this visit.    Past Medical History  Diagnosis Date  . Hypertension   . Obesity   . Atrial flutter   . Aortic insufficiency   . CHF (congestive heart failure)     Past Surgical History  Procedure Laterality Date  . None    . No past surgeries    . Bentall procedure N/A 12/18/2012    Procedure: BENTALL PROCEDURE;  Surgeon: Alleen BorneBryan K Bartle, MD;  [facility];  Service: Open Heart Surgery;  Laterality: N/A;  Biopsy of Media Stinal Adenopathy  . Mitral valve repair N/A 12/18/2012    Procedure: MITRAL VALVE  REPAIR (MVR);  Surgeon: Alleen BorneBryan K Bartle, MD;  [facility];  Service: Open Heart Surgery;  Laterality: N/A;  . Intraoperative transesophageal echocardiogram N/A 12/18/2012    Procedure: INTRAOPERATIVE TRANSESOPHAGEAL ECHOCARDIOGRAM;  Surgeon: Alleen BorneBryan K Bartle, MD;  [facility];  Service: Open Heart Surgery;  Laterality: N/A;  . Biopsy of mediastinal mass N/A 12/18/2012    Procedure: BIOPSY OF MEDIASTINAL MASS;  Surgeon: Alleen BorneBryan K Bartle, MD;  [facility] OR;  Service: Open Heart Surgery;  Laterality: N/A;    ROS:  Positive for dizziness. Otherwise as stated in the history of present illness and negative for all other systems.  PHYSICAL EXAM BP 178/106 mmHg  Pulse 71  Ht 6\' 4"  (1.93 m)  Wt 269 lb (122.018 kg)  BMI 32.76 kg/m2 GENERAL:  Well appearing NECK:  No jugular venous distention, waveform within normal limits, carotid upstroke brisk and symmetric, no bruits, no thyromegaly LUNGS:  Clear to auscultation bilaterally BACK:  No CVA tenderness CHEST: Well healed sternotomy scar. HEART:  PMI not displaced or sustained, normal S1 mechanical S2 within normal limits, no S3, no S4, no clicks, no rubs, brief systolic murmur heard at the apex  ABD:  Flat, positive bowel sounds normal in frequency in pitch, no bruits, no rebound, no guarding, no midline pulsatile mass, no hepatomegaly, no splenomegaly EXT:  2 plus pulses, mild bilateral lower extremity edema, no  cyanosis no clubbing   EKG:  Sinus rhythm, rate 71, left axis deviation, left atrial enlargement, poor anterior R wave progression, no acute ST-T wave changes. 07/14/2014   ASSESSMENT AND PLAN  BENTALL:  This is stable on followup echo. No further imaging is indicated at this point.  MV REPAIR:   As above.  HTN:  His BP is not controlled.  I will add spironolactone.  I will check a BMET in one month.  OBESITY:  The patient understands the need to lose weight with diet and exercise. We have discussed specific strategies for  this.

## 2014-07-14 NOTE — Patient Instructions (Signed)
Please start Spironolactone 25 mg a day. Continue all other medications as listed.  Please have blood work in 1 month (BMP)   Follow up in 3 months [place].  Thank you for choosing [facility]!!

## 2014-08-18 IMAGING — CT CT HEART MORP W/ CTA COR W/ SCORE W/ CA W/CM &/OR W/O CM
1 of 16 series · 2 of 20 positions shown, 3 images · IV contrast (CONTRAST)
Comparison: Chest 11/03/2012

***ADDENDUM*** CREATED: 11/17/2012 [DATE]

OVER-READ INTERPRETATION - CT CHEST
The following report is an over-read performed by radiologist Dr.
[DATE].  This over-read does not include interpretation of
cardiac or coronary anatomy or pathology.  The CTA interpretation
by the cardiologist is attached.
INDICATION: Aortic Disection Surgical planning and question need
for bypass grafting
PROTOCOL: Unfortunately the patient was in rapid atrial flutter at
presentation.  He also had difficulty laying flat.  He was given 15
mg of i.v. lopressor and sublingual nitro.  Average flutter rate
during the scan was 70 bpm.  He was scanned on a Philips 256
scanner.  Given his atrial flutter we chose to scan him
retrospectively at 120 kV The 3D data set was analyzed on a [employer]ips
work station using MIP, VRT and MPR modes

[Series 11: ccta, 78.0% · axial · 0.53mm/px · z∈[-222,-137]mm · 2 of 759 slices shown, 3 images]
[im 253/759  vessel]
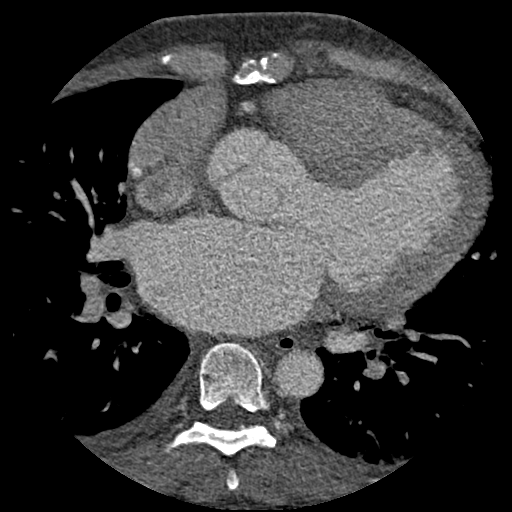
[im 253/759  lung]
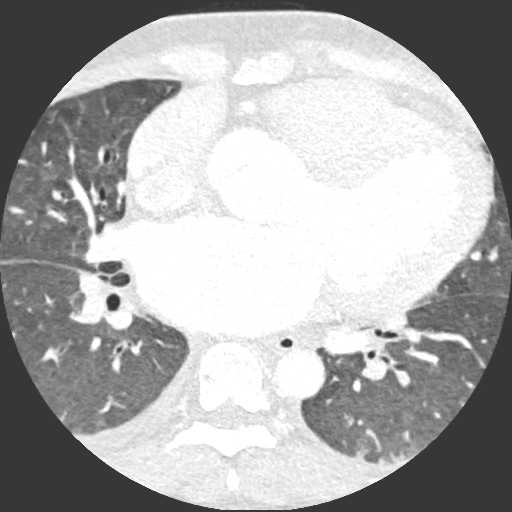
[im 506/759  vessel]
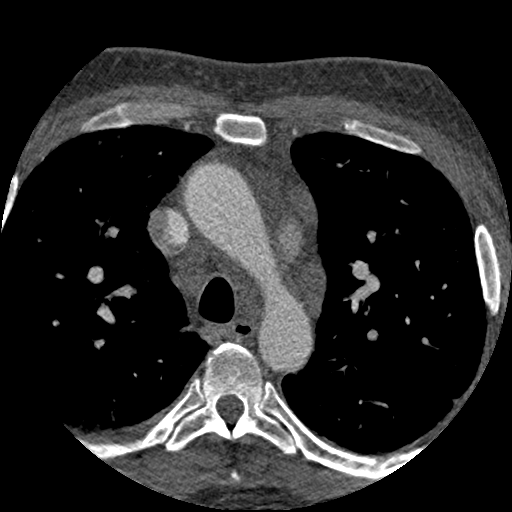

[2 of 20 positions shown; findings below may reference images not displayed]

FINDINGS: Patchy peripheral ground-glass densities are noted within
the lungs which are stable since prior study.  This could represent
areas of hypoaeration/atelectasis or mild alveolitis.

Stable ascending thoracic aortic aneurysm and probable focal
dissection proximally.  Stable mediastinal adenopathy and mild
cardiomegaly.  No acute bony abnormality.  Small right pleural
effusions.
IMPRESSION: Findings are similar to prior study except for slight enlargement
of a small right pleural effusion.  Continued cardiomegaly.
Ascending aortic aneurysm and dissection are stable.  Stable
mediastinal adenopathy.

***END ADDENDUM*** SIGNED BY: Lupitha Wojciechowski, M.D.
Cardiac CT:
FINDINGS: Non-cardiac:  Atelectasis and diffuse interstitial lung disease
especially at bases Mediastinal adenopathy

Calcium Score:  102 with one foci of calcification in the proximal
LAD

Coronary Arteries: Right dominant with no anomalies.

LM- normal

LAD- Less than 30% calcified disease proximally.  Normal mid and
distal vessel not well seen

      D: normal

Circumflex: normal with single OM that comes off LM like an
intermediate branch with small AV groove branch

RCA- large dominant vessel that is normal

Aorta:  There is a focal aortic dissection that appears to start
above the RCA and extends along the lateral aortic wall.  The arch,
great vessels and descending thoracic aorta are not involvled

Diameter of combined false and true lumen 6.3 cm
Arch diameter 3.3 cm
Descneding Thoracic Aorta 2.7 cm
IMPRESSION: Suboptimal study due to atrial flutter, filling of the false lumen
and aortic insufficiency that made filling the coronary arteries
difficult

   1)    Calcium scar 102 single area of calcification in proximal LAD

2)    Right dominant arteries with no evidence of high grade
disease that would need bypass.  Although the study is not ideal I
think it is diagnostic enough to avoid the risk of invasive cath
3)    Focal ascending aortic dissection with brisk filling of the
false lumen and combined diameter of 6.3 cm No involvement of
aortic arch
4)    Mediastinal adenopathy see separate report from [HOSPITAL]

## 2014-09-22 ENCOUNTER — Other Ambulatory Visit: Payer: Self-pay | Admitting: *Deleted

## 2014-09-22 MED ORDER — LOSARTAN POTASSIUM 100 MG PO TABS: 100.0000 mg | ORAL_TABLET | Freq: Every day | ORAL | Status: DC

## 2014-09-22 NOTE — Telephone Encounter (Signed)
Losartan refilled electronically with 1 refill. 

## 2014-10-06 ENCOUNTER — Ambulatory Visit: Payer: BLUE CROSS/BLUE SHIELD | Admitting: Cardiology

## 2014-10-15 ENCOUNTER — Encounter: Payer: Self-pay | Admitting: Cardiology

## 2014-10-28 ENCOUNTER — Encounter: Payer: Self-pay | Admitting: Cardiology

## 2014-12-17 ENCOUNTER — Other Ambulatory Visit: Payer: Self-pay | Admitting: Cardiology

## 2014-12-17 MED ORDER — LOSARTAN POTASSIUM 100 MG PO TABS: 100.0000 mg | ORAL_TABLET | Freq: Every day | ORAL | Status: DC

## 2015-01-26 ENCOUNTER — Other Ambulatory Visit: Payer: Self-pay | Admitting: *Deleted

## 2015-01-26 MED ORDER — AMLODIPINE BESYLATE 10 MG PO TABS: 10.0000 mg | ORAL_TABLET | Freq: Every evening | ORAL | Status: DC

## 2015-02-14 ENCOUNTER — Other Ambulatory Visit: Payer: Self-pay

## 2015-02-14 MED ORDER — LOSARTAN POTASSIUM 100 MG PO TABS: 100.0000 mg | ORAL_TABLET | Freq: Every day | ORAL | Status: DC

## 2015-02-14 NOTE — Telephone Encounter (Signed)
Rx(s) sent to pharmacy electronically.  

## 2015-03-28 ENCOUNTER — Encounter: Payer: Self-pay | Admitting: *Deleted

## 2015-03-28 ENCOUNTER — Other Ambulatory Visit: Payer: Self-pay | Admitting: Pharmacist Clinician (PhC)/ Clinical Pharmacy Specialist

## 2015-03-28 MED ORDER — WARFARIN SODIUM 5 MG PO TABS: ORAL_TABLET | ORAL | Status: DC

## 2015-03-28 NOTE — Telephone Encounter (Signed)
Spoke with patient, he has not had INR checked since January.  Been "busy and out of town".  Set appt to see Vashti HeyLisa Reid on Thursday in [place].  Rx refilled for 30 days only

## 2015-03-28 NOTE — Telephone Encounter (Signed)
This encounter was created in error - please disregard.

## 2015-03-31 ENCOUNTER — Ambulatory Visit (INDEPENDENT_AMBULATORY_CARE_PROVIDER_SITE_OTHER): Payer: BLUE CROSS/BLUE SHIELD | Admitting: *Deleted

## 2015-03-31 DIAGNOSIS — Z952 Presence of prosthetic heart valve: Secondary | ICD-10-CM

## 2015-03-31 DIAGNOSIS — Z5181 Encounter for therapeutic drug level monitoring: Secondary | ICD-10-CM

## 2015-03-31 DIAGNOSIS — I4892 Unspecified atrial flutter: Secondary | ICD-10-CM | POA: Diagnosis not present

## 2015-03-31 DIAGNOSIS — Z954 Presence of other heart-valve replacement: Secondary | ICD-10-CM | POA: Diagnosis not present

## 2015-03-31 LAB — POCT INR: INR: 3.4

## 2015-08-26 ENCOUNTER — Other Ambulatory Visit: Payer: Self-pay | Admitting: *Deleted

## 2015-08-26 MED ORDER — METOPROLOL TARTRATE 50 MG PO TABS: 50.0000 mg | ORAL_TABLET | Freq: Two times a day (BID) | ORAL | Status: DC

## 2015-09-22 ENCOUNTER — Other Ambulatory Visit: Payer: Self-pay | Admitting: Cardiology

## 2015-09-22 NOTE — Telephone Encounter (Signed)
Called pt as overdue for INR check. Able to schedule appt for 09/29/15. Will send supply to get him to appt.

## 2015-09-29 ENCOUNTER — Ambulatory Visit (INDEPENDENT_AMBULATORY_CARE_PROVIDER_SITE_OTHER): Payer: BLUE CROSS/BLUE SHIELD | Admitting: *Deleted

## 2015-09-29 DIAGNOSIS — Z5181 Encounter for therapeutic drug level monitoring: Secondary | ICD-10-CM

## 2015-09-29 DIAGNOSIS — Z952 Presence of prosthetic heart valve: Secondary | ICD-10-CM

## 2015-09-29 DIAGNOSIS — I4892 Unspecified atrial flutter: Secondary | ICD-10-CM

## 2015-09-29 DIAGNOSIS — Z954 Presence of other heart-valve replacement: Secondary | ICD-10-CM

## 2015-09-29 LAB — POCT INR: INR: 3.7

## 2015-09-29 MED ORDER — WARFARIN SODIUM 5 MG PO TABS: ORAL_TABLET | ORAL | Status: DC

## 2015-10-26 ENCOUNTER — Other Ambulatory Visit: Payer: Self-pay

## 2015-10-26 MED ORDER — METOPROLOL TARTRATE 50 MG PO TABS: 50.0000 mg | ORAL_TABLET | Freq: Two times a day (BID) | ORAL | Status: DC

## 2015-12-13 ENCOUNTER — Other Ambulatory Visit: Payer: Self-pay | Admitting: Cardiology

## 2016-04-05 ENCOUNTER — Ambulatory Visit (INDEPENDENT_AMBULATORY_CARE_PROVIDER_SITE_OTHER): Payer: BLUE CROSS/BLUE SHIELD | Admitting: *Deleted

## 2016-04-05 DIAGNOSIS — Z952 Presence of prosthetic heart valve: Secondary | ICD-10-CM | POA: Diagnosis not present

## 2016-04-05 DIAGNOSIS — Z5181 Encounter for therapeutic drug level monitoring: Secondary | ICD-10-CM | POA: Diagnosis not present

## 2016-04-05 DIAGNOSIS — I4892 Unspecified atrial flutter: Secondary | ICD-10-CM | POA: Diagnosis not present

## 2016-04-05 LAB — POCT INR: INR: 4

## 2016-04-11 ENCOUNTER — Other Ambulatory Visit: Payer: Self-pay | Admitting: Cardiology

## 2016-04-26 ENCOUNTER — Ambulatory Visit (INDEPENDENT_AMBULATORY_CARE_PROVIDER_SITE_OTHER): Payer: BLUE CROSS/BLUE SHIELD | Admitting: *Deleted

## 2016-04-26 DIAGNOSIS — Z5181 Encounter for therapeutic drug level monitoring: Secondary | ICD-10-CM | POA: Diagnosis not present

## 2016-04-26 DIAGNOSIS — Z952 Presence of prosthetic heart valve: Secondary | ICD-10-CM

## 2016-04-26 DIAGNOSIS — I4892 Unspecified atrial flutter: Secondary | ICD-10-CM

## 2016-04-26 LAB — POCT INR: INR: 3.3

## 2016-05-22 ENCOUNTER — Other Ambulatory Visit: Payer: Self-pay | Admitting: Cardiology

## 2016-05-22 ENCOUNTER — Other Ambulatory Visit: Payer: Self-pay | Admitting: *Deleted

## 2016-05-22 ENCOUNTER — Telehealth: Payer: Self-pay | Admitting: *Deleted

## 2016-05-22 MED ORDER — WARFARIN SODIUM 5 MG PO TABS: ORAL_TABLET | ORAL | 3 refills | Status: DC

## 2016-05-22 NOTE — Telephone Encounter (Signed)
warfarin (COUMADIN) 5 MG tablet  [facility] [place] [address] [phone] (Phone) [phone] (Fax)

## 2016-05-22 NOTE — Telephone Encounter (Signed)
REFILL 

## 2016-05-22 NOTE — Telephone Encounter (Signed)
Rx sent in

## 2016-05-24 ENCOUNTER — Ambulatory Visit (INDEPENDENT_AMBULATORY_CARE_PROVIDER_SITE_OTHER): Payer: BLUE CROSS/BLUE SHIELD | Admitting: *Deleted

## 2016-05-24 DIAGNOSIS — Z5181 Encounter for therapeutic drug level monitoring: Secondary | ICD-10-CM | POA: Diagnosis not present

## 2016-05-24 DIAGNOSIS — I4892 Unspecified atrial flutter: Secondary | ICD-10-CM | POA: Diagnosis not present

## 2016-05-24 DIAGNOSIS — Z952 Presence of prosthetic heart valve: Secondary | ICD-10-CM

## 2016-05-24 LAB — POCT INR: INR: 1.8

## 2016-06-05 NOTE — Progress Notes (Signed)
HPI The patient presents for follow up of ascending aortic dissection and moderate mitral regurgitation status post Bentall with mechanical aortic valve and mitral valve repair. Of note he did have atrial flutter as well.  Since he was last seen  He has had problems with back issues and leg weakness.  He iIs actually not working.  He fell.  The patient denies any new symptoms such as chest discomfort, neck or arm discomfort. There has been no new shortness of breath, PND or orthopnea. There have been no reported palpitations, presyncope or syncope.  No Known Allergies  Current Outpatient Prescriptions  Medication Sig Dispense Refill  . amLODipine (NORVASC) 10 MG tablet Take 1 tablet (10 mg total) by mouth every evening. 30 tablet 6  . losartan (COZAAR) 100 MG tablet Take 1.5 tablets (150 mg total) by mouth daily. 135 tablet 3  . metoprolol (LOPRESSOR) 50 MG tablet TAKE ONE TABLET TWICE DAILY (NEED DR VISIT) 60 tablet 1  . warfarin (COUMADIN) 5 MG tablet Take 2 tablets daily except 3 tablets on Saturdays 70 tablet 3   No current facility-administered medications for this visit.     Past Medical History:  Diagnosis Date  . Aortic insufficiency   . Atrial flutter (HCC)   . CHF (congestive heart failure) (HCC)   . Hypertension   . Obesity     Past Surgical History:  Procedure Laterality Date  . BENTALL PROCEDURE N/A 12/18/2012   Procedure: BENTALL PROCEDURE;  Surgeon: Alleen Borne, MD;  Location: [facility] OR;  Service: Open Heart Surgery;  Laterality: N/A;  Biopsy of Media Stinal Adenopathy  . BIOPSY OF MEDIASTINAL MASS N/A 12/18/2012   Procedure: BIOPSY OF MEDIASTINAL MASS;  Surgeon: Alleen Borne, MD;  Location: [facility] OR;  Service: Open Heart Surgery;  Laterality: N/A;  . INTRAOPERATIVE TRANSESOPHAGEAL ECHOCARDIOGRAM N/A 12/18/2012   Procedure: INTRAOPERATIVE TRANSESOPHAGEAL ECHOCARDIOGRAM;  Surgeon: Alleen Borne, MD;  Location: [facility] OR;  Service: Open Heart Surgery;  Laterality: N/A;  .  MITRAL VALVE REPAIR N/A 12/18/2012   Procedure: MITRAL VALVE REPAIR (MVR);  Surgeon: Alleen Borne, MD;  Location: [facility] OR;  Service: Open Heart Surgery;  Laterality: N/A;  . NO PAST SURGERIES    . None      ROS:  Leg numbness left leg. Otherwise as stated in the history of present illness and negative for all other systems.  PHYSICAL EXAM BP (!) 158/94   Pulse 68   Ht 6\' 3"  (1.905 m)   Wt 286 lb (129.7 kg)   BMI 35.75 kg/m  GENERAL:  Well appearing NECK:  No jugular venous distention, waveform within normal limits, carotid upstroke brisk and symmetric, no bruits, no thyromegaly LUNGS:  Clear to auscultation bilaterally BACK:  No CVA tenderness CHEST: Well healed sternotomy scar. HEART:  PMI not displaced or sustained, normal S1 mechanical S2 within normal limits, no S3, no S4, no clicks, no rubs, brief systolic murmur heard at the apex  ABD:  Flat, positive bowel sounds normal in frequency in pitch, no bruits, no rebound, no guarding, no midline pulsatile mass, no hepatomegaly, no splenomegaly EXT:  2 plus pulses, mild bilateral lower extremity edema, no cyanosis no clubbing   EKG:  Sinus rhythm, rate 66, left axis deviation, left atrial enlargement, poor anterior R wave progression, no acute ST-T wave changes. 06/06/2016   ASSESSMENT AND PLAN  BENTALL:  This is stable on followup echo.  He will have an echo as below  MV REPAIR:  He has not had imaging since 2015.  I will follow up with an echocardiogram.   HTN:  His BP is not controlled.  I tried to add spironolactone.  He would not take this.  I suggested 10 pounds of weight loss and also he does agree to take 150 mg of Cozaar and keep a blood pressure diary.  OBESITY:  The patient understands the need to lose weight with diet and exercise. .Marland Kitchen

## 2016-06-06 ENCOUNTER — Encounter: Payer: Self-pay | Admitting: Cardiology

## 2016-06-06 ENCOUNTER — Ambulatory Visit (INDEPENDENT_AMBULATORY_CARE_PROVIDER_SITE_OTHER): Payer: BLUE CROSS/BLUE SHIELD | Admitting: Cardiology

## 2016-06-06 VITALS — BP 158/94 | HR 68 | Ht 75.0 in | Wt 286.0 lb

## 2016-06-06 DIAGNOSIS — Z952 Presence of prosthetic heart valve: Secondary | ICD-10-CM

## 2016-06-06 DIAGNOSIS — I10 Essential (primary) hypertension: Secondary | ICD-10-CM | POA: Diagnosis not present

## 2016-06-06 MED ORDER — LOSARTAN POTASSIUM 100 MG PO TABS: 150.0000 mg | ORAL_TABLET | Freq: Every day | ORAL | 3 refills | Status: DC

## 2016-06-06 NOTE — Patient Instructions (Addendum)
Medication Instructions:  Please increase Losartan to 150 mg a day. Continue all other medications as listed.  Testing/Procedures: Your physician has requested that you have an echocardiogram. Echocardiography is a painless test that uses sound waves to create images of your heart. It provides your doctor with information about the size and shape of your heart and how well your heart's chambers and valves are working. This procedure takes approximately one hour. There are no restrictions for this procedure.  (2/28 at 11AM-[place] office)  Follow-Up: Follow up in 1 year with Dr. Antoine PocheHochrein.  You will receive a letter in the mail 2 months before you are due.  Please call us when you receive this letter to schedule your follow up appointment.  If you need a refill on your cardiac medications before your next appointment, please call your pharmacy.  Thank you for choosing [facility]!!

## 2016-06-13 ENCOUNTER — Ambulatory Visit (INDEPENDENT_AMBULATORY_CARE_PROVIDER_SITE_OTHER): Payer: BLUE CROSS/BLUE SHIELD

## 2016-06-13 ENCOUNTER — Other Ambulatory Visit: Payer: Self-pay

## 2016-06-13 ENCOUNTER — Ambulatory Visit (INDEPENDENT_AMBULATORY_CARE_PROVIDER_SITE_OTHER): Payer: BLUE CROSS/BLUE SHIELD | Admitting: *Deleted

## 2016-06-13 DIAGNOSIS — I10 Essential (primary) hypertension: Secondary | ICD-10-CM

## 2016-06-13 DIAGNOSIS — Z5181 Encounter for therapeutic drug level monitoring: Secondary | ICD-10-CM

## 2016-06-13 DIAGNOSIS — I4892 Unspecified atrial flutter: Secondary | ICD-10-CM

## 2016-06-13 DIAGNOSIS — Z952 Presence of prosthetic heart valve: Secondary | ICD-10-CM

## 2016-06-13 LAB — POCT INR: INR: 3.8

## 2016-06-27 ENCOUNTER — Other Ambulatory Visit: Payer: BLUE CROSS/BLUE SHIELD

## 2016-10-12 ENCOUNTER — Other Ambulatory Visit: Payer: Self-pay | Admitting: Cardiology

## 2017-03-07 ENCOUNTER — Ambulatory Visit (INDEPENDENT_AMBULATORY_CARE_PROVIDER_SITE_OTHER): Payer: BLUE CROSS/BLUE SHIELD | Admitting: *Deleted

## 2017-03-07 DIAGNOSIS — I4892 Unspecified atrial flutter: Secondary | ICD-10-CM | POA: Diagnosis not present

## 2017-03-07 DIAGNOSIS — Z5181 Encounter for therapeutic drug level monitoring: Secondary | ICD-10-CM | POA: Diagnosis not present

## 2017-03-07 DIAGNOSIS — Z952 Presence of prosthetic heart valve: Secondary | ICD-10-CM | POA: Diagnosis not present

## 2017-03-07 LAB — POCT INR: INR: 1.1

## 2017-03-26 ENCOUNTER — Ambulatory Visit (INDEPENDENT_AMBULATORY_CARE_PROVIDER_SITE_OTHER): Payer: BLUE CROSS/BLUE SHIELD | Admitting: *Deleted

## 2017-03-26 DIAGNOSIS — Z952 Presence of prosthetic heart valve: Secondary | ICD-10-CM | POA: Diagnosis not present

## 2017-03-26 DIAGNOSIS — I4892 Unspecified atrial flutter: Secondary | ICD-10-CM

## 2017-03-26 DIAGNOSIS — Z5181 Encounter for therapeutic drug level monitoring: Secondary | ICD-10-CM

## 2017-03-26 LAB — POCT INR: INR: 2

## 2017-09-23 NOTE — Progress Notes (Signed)
HPI The patient presents for follow up of ascending aortic dissection and moderate mitral regurgitation status post Bentall with mechanical aortic valve and mitral valve repair.  Since I last saw him he said several weeks ago he started having increasing swelling.  He was more short of breath and seems to be describing some orthopnea.  He was treated with Lasix 40 mg which she is taking daily.  He said he had improvement in his fluid in his breathing.  He walks his "Bullies" and does stuff around his home.  With this he denies any cardiovascular symptoms such as chest discomfort, neck or arm discomfort.  He is in atrial fibrillation today but does not really notice this.  He has not had any palpitations, presyncope or syncope.   No Known Allergies  Current Outpatient Medications  Medication Sig Dispense Refill  . amLODipine (NORVASC) 10 MG tablet Take 1 tablet (10 mg total) by mouth every evening. 30 tablet 6  . losartan (COZAAR) 100 MG tablet Take 1.5 tablets (150 mg total) by mouth daily. 135 tablet 3  . metoprolol tartrate (LOPRESSOR) 50 MG tablet Take 1 tablet (50 mg total) by mouth 2 (two) times daily. 60 tablet 8  . potassium chloride SA (K-DUR,KLOR-CON) 20 MEQ tablet Take 20 mEq by mouth daily.     Marland Kitchen warfarin (COUMADIN) 5 MG tablet TAKE 2 TABLETS DAILY EXCEPT 3 TABLETS ON SATURDAYS 30 tablet 0  . furosemide (LASIX) 40 MG tablet Take 40 mg by mouth daily.      No current facility-administered medications for this visit.     Past Medical History:  Diagnosis Date  . Aortic insufficiency   . Atrial flutter (HCC)   . CHF (congestive heart failure) (HCC)   . Hypertension   . Obesity     Past Surgical History:  Procedure Laterality Date  . BENTALL PROCEDURE N/A 12/18/2012   Procedure: BENTALL PROCEDURE;  Surgeon: Alleen Borne, MD;  Location: [facility] OR;  Service: Open Heart Surgery;  Laterality: N/A;  Biopsy of Media Stinal Adenopathy  . BIOPSY OF MEDIASTINAL MASS N/A 12/18/2012   Procedure: BIOPSY OF MEDIASTINAL MASS;  Surgeon: Alleen Borne, MD;  Location: [facility] OR;  Service: Open Heart Surgery;  Laterality: N/A;  . INTRAOPERATIVE TRANSESOPHAGEAL ECHOCARDIOGRAM N/A 12/18/2012   Procedure: INTRAOPERATIVE TRANSESOPHAGEAL ECHOCARDIOGRAM;  Surgeon: Alleen Borne, MD;  Location: [facility] OR;  Service: Open Heart Surgery;  Laterality: N/A;  . MITRAL VALVE REPAIR N/A 12/18/2012   Procedure: MITRAL VALVE REPAIR (MVR);  Surgeon: Alleen Borne, MD;  Location: [facility] OR;  Service: Open Heart Surgery;  Laterality: N/A;  . NO PAST SURGERIES    . None      ROS:  As stated in the HPI and negative for all other systems.  PHYSICAL EXAM BP 120/72   Pulse 60   Ht  (1.905 m)   Wt 274 lb (124.3 kg)   BMI 34.25 kg/m   GENERAL:  Well appearing NECK:  No jugular venous distention, waveform within normal limits, carotid upstroke brisk and symmetric, no bruits, no thyromegaly LUNGS:  Clear to auscultation bilaterally CHEST:  Well healed sternotomy scar. HEART:  PMI not displaced or sustained,S1 and mechanical S2 within normal limits, no S3, no clicks, no rubs, no murmurs, irregular ABD:  Flat, positive bowel sounds normal in frequency in pitch, no bruits, no rebound, no guarding, no midline pulsatile mass, no hepatomegaly, no splenomegaly EXT:  2 plus pulses throughout, mild edema, no cyanosis no clubbing  EKG   atrial fibrillation, rate , left axis deviation, poor anterior R wave progression, no acute ST-T wave changes.  Fibrillation is new compared to previous. 09/25/2017   ASSESSMENT AND PLAN  BENTALL:    I will follow-up with an echo given the change in his rhythm and the edema.  MV REPAIR:   The valve looked OK last year on echo.  However, I will follow-up as above.  HTN:  His BP is well controlled.  He will continue the meds as listed.   OBESITY:   He has maintained weight loss and he understands the continued importance of exercise.   ATRIAL FIB: He does not notice this  rhythm.  I am going to apply a 24-hour Holter and check an echocardiogram.  EDEMA: He seems to be improved symptomatically.  I will follow-up with an echocardiogram as above.  He will continue current dose of Lasix.

## 2017-09-25 ENCOUNTER — Ambulatory Visit (INDEPENDENT_AMBULATORY_CARE_PROVIDER_SITE_OTHER): Payer: BLUE CROSS/BLUE SHIELD | Admitting: Cardiology

## 2017-09-25 ENCOUNTER — Encounter: Payer: Self-pay | Admitting: Cardiology

## 2017-09-25 VITALS — BP 120/72 | HR 60 | Ht 75.0 in | Wt 274.0 lb

## 2017-09-25 DIAGNOSIS — M7989 Other specified soft tissue disorders: Secondary | ICD-10-CM | POA: Diagnosis not present

## 2017-09-25 DIAGNOSIS — Z952 Presence of prosthetic heart valve: Secondary | ICD-10-CM

## 2017-09-25 DIAGNOSIS — I10 Essential (primary) hypertension: Secondary | ICD-10-CM

## 2017-09-25 DIAGNOSIS — I4892 Unspecified atrial flutter: Secondary | ICD-10-CM | POA: Diagnosis not present

## 2017-09-25 NOTE — Patient Instructions (Signed)
Medication Instructions:  The current medical regimen is effective;  continue present plan and medications.  Testing/Procedures: Your physician has requested that you have an echocardiogram. Echocardiography is a painless test that uses sound waves to create images of your heart. It provides your doctor with information about the size and shape of your heart and how well your heart's chambers and valves are working. This procedure takes approximately one hour. There are no restrictions for this procedure.  Your physician has recommended that you wear a holter monitor. Holter monitors are medical devices that record the heart's electrical activity. Doctors most often use these monitors to diagnose arrhythmias. Arrhythmias are problems with the speed or rhythm of the heartbeat. The monitor is a small, portable device. You can wear one while you do your normal daily activities. This is usually used to diagnose what is causing palpitations/syncope (passing out).  Follow-Up: Further follow up will be determined after the results of the above testing.  If you need a refill on your cardiac medications before your next appointment, please call your pharmacy.  Thank you for choosing [facility]!!

## 2017-10-03 ENCOUNTER — Ambulatory Visit (INDEPENDENT_AMBULATORY_CARE_PROVIDER_SITE_OTHER): Payer: BLUE CROSS/BLUE SHIELD

## 2017-10-03 ENCOUNTER — Ambulatory Visit (HOSPITAL_COMMUNITY): Payer: BLUE CROSS/BLUE SHIELD | Attending: Cardiology

## 2017-10-03 ENCOUNTER — Other Ambulatory Visit: Payer: Self-pay

## 2017-10-03 DIAGNOSIS — I509 Heart failure, unspecified: Secondary | ICD-10-CM | POA: Diagnosis not present

## 2017-10-03 DIAGNOSIS — I4892 Unspecified atrial flutter: Secondary | ICD-10-CM | POA: Diagnosis not present

## 2017-10-03 DIAGNOSIS — I4891 Unspecified atrial fibrillation: Secondary | ICD-10-CM | POA: Insufficient documentation

## 2017-10-03 DIAGNOSIS — Z952 Presence of prosthetic heart valve: Secondary | ICD-10-CM | POA: Diagnosis not present

## 2017-10-03 DIAGNOSIS — Z6834 Body mass index (BMI) 34.0-34.9, adult: Secondary | ICD-10-CM | POA: Diagnosis not present

## 2017-10-03 DIAGNOSIS — I110 Hypertensive heart disease with heart failure: Secondary | ICD-10-CM | POA: Insufficient documentation

## 2017-10-03 DIAGNOSIS — I081 Rheumatic disorders of both mitral and tricuspid valves: Secondary | ICD-10-CM | POA: Insufficient documentation

## 2017-10-03 DIAGNOSIS — E669 Obesity, unspecified: Secondary | ICD-10-CM | POA: Insufficient documentation

## 2017-10-03 DIAGNOSIS — I10 Essential (primary) hypertension: Secondary | ICD-10-CM | POA: Diagnosis not present

## 2017-12-04 ENCOUNTER — Ambulatory Visit: Payer: BLUE CROSS/BLUE SHIELD | Admitting: Cardiology

## 2017-12-06 ENCOUNTER — Ambulatory Visit: Payer: BLUE CROSS/BLUE SHIELD | Admitting: Cardiology

## 2018-06-13 ENCOUNTER — Telehealth: Payer: Self-pay

## 2018-06-13 NOTE — Telephone Encounter (Signed)
Called to let pt know that they are overdue for their coumadin and med will not be refilled until seen in the office

## 2019-01-22 ENCOUNTER — Other Ambulatory Visit (INDEPENDENT_AMBULATORY_CARE_PROVIDER_SITE_OTHER): Payer: BC Managed Care – PPO

## 2019-01-22 DIAGNOSIS — Z5181 Encounter for therapeutic drug level monitoring: Secondary | ICD-10-CM

## 2019-02-04 ENCOUNTER — Ambulatory Visit (INDEPENDENT_AMBULATORY_CARE_PROVIDER_SITE_OTHER): Payer: BC Managed Care – PPO | Admitting: *Deleted

## 2019-02-04 ENCOUNTER — Other Ambulatory Visit: Payer: Self-pay

## 2019-02-04 DIAGNOSIS — Z952 Presence of prosthetic heart valve: Secondary | ICD-10-CM | POA: Diagnosis not present

## 2019-02-04 DIAGNOSIS — Z5181 Encounter for therapeutic drug level monitoring: Secondary | ICD-10-CM | POA: Diagnosis not present

## 2019-02-04 LAB — POCT INR: INR: 2.6 (ref 2.0–3.0)

## 2019-02-04 NOTE — Patient Instructions (Signed)
Increase warfarin to 2 tablets daily except 3 tablets on Wednesdays and Saturdays Has been taking 1 extra tablet on Saturday several x month Recheck in 6 week

## 2019-07-20 ENCOUNTER — Telehealth: Payer: Self-pay | Admitting: *Deleted

## 2019-07-20 MED ORDER — WARFARIN SODIUM 5 MG PO TABS: ORAL_TABLET | ORAL | 0 refills | Status: DC

## 2019-07-20 NOTE — Telephone Encounter (Signed)
*  STAT* If patient is at the pharmacy, call can be transferred to refill team.   1. Which medications need to be refilled? (please list name of each medication and dose if known)  coumdin   2. Which pharmacy/location (including street and city if local pharmacy) is medication to be sent to? [facility], [place], [place]   Telephone # [phone]  3. Do they need a 30 day or 90 day supply?   Has coumdin appointment 07/22/2019

## 2019-07-22 ENCOUNTER — Ambulatory Visit (INDEPENDENT_AMBULATORY_CARE_PROVIDER_SITE_OTHER): Payer: BC Managed Care – PPO | Admitting: *Deleted

## 2019-07-22 ENCOUNTER — Other Ambulatory Visit: Payer: Self-pay

## 2019-07-22 DIAGNOSIS — I4892 Unspecified atrial flutter: Secondary | ICD-10-CM

## 2019-07-22 DIAGNOSIS — Z952 Presence of prosthetic heart valve: Secondary | ICD-10-CM

## 2019-07-22 DIAGNOSIS — Z5181 Encounter for therapeutic drug level monitoring: Secondary | ICD-10-CM

## 2019-07-22 LAB — POCT INR: INR: 2.5 (ref 2.0–3.0)

## 2019-07-22 MED ORDER — WARFARIN SODIUM 5 MG PO TABS: ORAL_TABLET | ORAL | 2 refills | Status: DC

## 2019-07-22 NOTE — Patient Instructions (Signed)
Continue 2 tablets daily except 3 tablets on Wednesdays and Saturdays Recheck in 6 week Has been getting blood checked at Dr Bunnie Pion office too.  Will come here regularly now per pt.

## 2019-08-19 ENCOUNTER — Ambulatory Visit: Payer: BC Managed Care – PPO | Admitting: Cardiology

## 2019-09-01 DIAGNOSIS — Z9889 Other specified postprocedural states: Secondary | ICD-10-CM | POA: Insufficient documentation

## 2019-09-01 DIAGNOSIS — I482 Chronic atrial fibrillation, unspecified: Secondary | ICD-10-CM | POA: Insufficient documentation

## 2019-09-01 DIAGNOSIS — Z7189 Other specified counseling: Secondary | ICD-10-CM | POA: Insufficient documentation

## 2019-09-01 NOTE — Progress Notes (Signed)
Cardiology Office Note   Date:  09/02/2019   ID:  Sean Juarez, DOB 02-Dec-1970, MRN [id number]  PCP:  Ranae Palms, MD  Cardiologist:   No primary care provider on file. Referring:  Ranae Palms, MD  Chief Complaint  Patient presents with  . MVR      History of Present Illness: Sean Juarez is a 49 y.o. male who presents for follow up of ascending aortic dissection and moderate mitral regurgitation status post Bentall with mechanical aortic valve and mitral valve repair.  Since I last saw him he has done well.  He still working Architect.  He has some lower extremity swelling and a wound in his leg that he has had some difficulty healing with currently eschar and is not having any drainage or erythema.  He denies any chest pressure, neck or arm discomfort.  He said no shortness of breath, PND or orthopnea.  He has had some steady weight loss.   Past Medical History:  Diagnosis Date  . Aortic insufficiency   . Atrial flutter ([place])   . CHF (congestive heart failure) ([place])   . Hypertension   . Obesity     Past Surgical History:  Procedure Laterality Date  . BENTALL PROCEDURE N/A 12/18/2012   Procedure: BENTALL PROCEDURE;  Surgeon: Gaye Pollack, MD;  Location: [facility];  Service: Open Heart Surgery;  Laterality: N/A;  Biopsy of Media Stinal Adenopathy  . BIOPSY OF MEDIASTINAL MASS N/A 12/18/2012   Procedure: BIOPSY OF MEDIASTINAL MASS;  Surgeon: Gaye Pollack, MD;  Location: [facility];  Service: Open Heart Surgery;  Laterality: N/A;  . INTRAOPERATIVE TRANSESOPHAGEAL ECHOCARDIOGRAM N/A 12/18/2012   Procedure: INTRAOPERATIVE TRANSESOPHAGEAL ECHOCARDIOGRAM;  Surgeon: Gaye Pollack, MD;  Location: [facility] OR;  Service: Open Heart Surgery;  Laterality: N/A;  . MITRAL VALVE REPAIR N/A 12/18/2012   Procedure: MITRAL VALVE REPAIR (MVR);  Surgeon: Gaye Pollack, MD;  Location: [facility];  Service: Open Heart Surgery;  Laterality: N/A;  . NO PAST SURGERIES    . None       Current Outpatient  Medications  Medication Sig Dispense Refill  . amLODipine (NORVASC) 10 MG tablet Take 1 tablet (10 mg total) by mouth every evening. 30 tablet 6  . furosemide (LASIX) 40 MG tablet Take 40 mg by mouth daily.     Marland Kitchen losartan (COZAAR) 100 MG tablet Take 1.5 tablets (150 mg total) by mouth daily. 135 tablet 3  . metoprolol tartrate (LOPRESSOR) 50 MG tablet Take 1 tablet (50 mg total) by mouth 2 (two) times daily. 60 tablet 8  . potassium chloride SA (K-DUR,KLOR-CON) 20 MEQ tablet Take 20 mEq by mouth daily.     Marland Kitchen warfarin (COUMADIN) 5 MG tablet Take 2 tablets daily except 3 tablets on Wednesdays and Saturdays or as directed 70 tablet 2   No current facility-administered medications for this visit.    Allergies:   Patient has no known allergies.    ROS:  Please see the history of present illness.   Otherwise, review of systems are positive for none.   All other systems are reviewed and negative.    PHYSICAL EXAM: VS:  BP 138/90   Pulse 71   Ht 6\' 3"  (1.905 m)   Wt 262 lb (118.8 kg)   BMI 32.75 kg/m  , BMI Body mass index is 32.75 kg/m. GENERAL:  Well appearing NECK:  No jugular venous distention, waveform within normal limits, carotid upstroke brisk and  symmetric, no bruits, no thyromegaly LUNGS:  Clear to auscultation bilaterally CHEST:  Well healed sternotomy scar. HEART:  PMI not displaced or sustained, mechanical S1 and S2 within normal limits, no S3, no S4, no clicks, no rubs, no murmurs ABD:  Flat, positive bowel sounds normal in frequency in pitch, no bruits, no rebound, no guarding, no midline pulsatile mass, no hepatomegaly, no splenomegaly EXT:  2 plus pulses throughout, mild bilateral lower extremity leg edema, no cyanosis no clubbing, chronic venous stasis changes ,  EKG:  EKG is ordered today. The ekg ordered today demonstrates atrial fibrillation, rate 71, poor anterior R wave progression, left axis deviation, premature ventricular contraction, no acute T wave changes.    Recent Labs: No results found for requested labs within last 8760 hours.    Lipid Panel No results found for: CHOL, TRIG, HDL, CHOLHDL, VLDL, LDLCALC, LDLDIRECT    Wt Readings from Last 3 Encounters:  09/02/19 262 lb (118.8 kg)  09/25/17 274 lb (124.3 kg)  06/06/16 286 lb (129.7 kg)      Other studies Reviewed: Additional studies/ records that were reviewed today include: None. Review of the above records demonstrates:  Please see elsewhere in the note.     ASSESSMENT AND PLAN:  BENTALL:    I will follow this up with an echocardiogram as below.  MV REPAIR:    He will get an echocardiogram.  He understands endocarditis prophylaxis.  We talked about good dental hygiene.  He is up-to-date with anticoagulation.  HTN:  His BP is controlled.  No change in therapy.  OBESITY:   I am proud of his weight loss and we talked about 242 pounds as a goal.   ATRIAL FIB:   He tolerates this rhythm with rate control and anticoagulation.    EDEMA:  We talked about conservative therapies of this.  He is getting get some compression stockings.  He uses as needed Lasix, reduce his salt, keep his legs elevated.   COVID EDUCATION: She has not had the vaccine he talked about this.  I gave him some education around it.    Current medicines are reviewed at length with the patient today.  The patient does not have concerns regarding medicines.  The following changes have been made:  no change  Labs/ tests ordered today include:   Orders Placed This Encounter  Procedures  . EKG 12-Lead  . ECHOCARDIOGRAM COMPLETE     Disposition:   FU with me in one year.     Signed, Rollene Rotunda, MD  09/02/2019 11:11 AM    [facility]

## 2019-09-02 ENCOUNTER — Other Ambulatory Visit: Payer: Self-pay | Admitting: Cardiology

## 2019-09-02 ENCOUNTER — Other Ambulatory Visit: Payer: Self-pay

## 2019-09-02 ENCOUNTER — Ambulatory Visit (INDEPENDENT_AMBULATORY_CARE_PROVIDER_SITE_OTHER): Payer: BC Managed Care – PPO | Admitting: Cardiology

## 2019-09-02 ENCOUNTER — Encounter: Payer: Self-pay | Admitting: Cardiology

## 2019-09-02 ENCOUNTER — Ambulatory Visit (INDEPENDENT_AMBULATORY_CARE_PROVIDER_SITE_OTHER): Payer: BC Managed Care – PPO | Admitting: *Deleted

## 2019-09-02 VITALS — BP 138/90 | HR 71 | Ht 75.0 in | Wt 262.0 lb

## 2019-09-02 DIAGNOSIS — Z952 Presence of prosthetic heart valve: Secondary | ICD-10-CM

## 2019-09-02 DIAGNOSIS — I10 Essential (primary) hypertension: Secondary | ICD-10-CM | POA: Diagnosis not present

## 2019-09-02 DIAGNOSIS — Z5181 Encounter for therapeutic drug level monitoring: Secondary | ICD-10-CM

## 2019-09-02 DIAGNOSIS — I482 Chronic atrial fibrillation, unspecified: Secondary | ICD-10-CM

## 2019-09-02 DIAGNOSIS — I059 Rheumatic mitral valve disease, unspecified: Secondary | ICD-10-CM

## 2019-09-02 DIAGNOSIS — I359 Nonrheumatic aortic valve disorder, unspecified: Secondary | ICD-10-CM

## 2019-09-02 DIAGNOSIS — Z9889 Other specified postprocedural states: Secondary | ICD-10-CM

## 2019-09-02 DIAGNOSIS — Z7189 Other specified counseling: Secondary | ICD-10-CM

## 2019-09-02 LAB — POCT INR: INR: 5.5 — AB (ref 2.0–3.0)

## 2019-09-02 NOTE — Patient Instructions (Addendum)
Hold warfarin tonight and tomorrow night then resume 2 tablets daily except 3 tablets on Wednesdays and Saturdays Recheck in 2 weeks Denies any sign of bleeding.  Bleeding and fall precautions discussed with pt and he verbalized understanding

## 2019-09-02 NOTE — Patient Instructions (Signed)
Medication Instructions:  The current medical regimen is effective;  continue present plan and medications.  *If you need a refill on your cardiac medications before your next appointment, please call your pharmacy*  Testing/Procedures: Your physician has requested that you have an echocardiogram. Echocardiography is a painless test that uses sound waves to create images of your heart. It provides your doctor with information about the size and shape of your heart and how well your heart's chambers and valves are working. This procedure takes approximately one hour. There are no restrictions for this procedure.  Follow-Up: At [facility], you and your health needs are our priority.  As part of our continuing mission to provide you with exceptional heart care, we have created designated Provider Care Teams.  These Care Teams include your primary Cardiologist (physician) and Advanced Practice Providers (APPs -  Physician Assistants and Nurse Practitioners) who all work together to provide you with the care you need, when you need it.  We recommend signing up for the patient portal called "MyChart".  Sign up information is provided on this After Visit Summary.  MyChart is used to connect with patients for Virtual Visits (Telemedicine).  Patients are able to view lab/test results, encounter notes, upcoming appointments, etc.  Non-urgent messages can be sent to your provider as well.   To learn more about what you can do with MyChart, go to [email or web address].    Your next appointment:   12 month(s)  The format for your next appointment:   In Person  Provider:   Rollene Rotunda, MD   Thank you for choosing [facility]!!    Knee-high compression stockings 20-30 mm/hg

## 2019-09-14 ENCOUNTER — Ambulatory Visit (INDEPENDENT_AMBULATORY_CARE_PROVIDER_SITE_OTHER): Payer: BC Managed Care – PPO | Admitting: *Deleted

## 2019-09-14 ENCOUNTER — Other Ambulatory Visit: Payer: Self-pay

## 2019-09-14 DIAGNOSIS — I482 Chronic atrial fibrillation, unspecified: Secondary | ICD-10-CM | POA: Diagnosis not present

## 2019-09-14 DIAGNOSIS — Z5181 Encounter for therapeutic drug level monitoring: Secondary | ICD-10-CM

## 2019-09-14 DIAGNOSIS — Z952 Presence of prosthetic heart valve: Secondary | ICD-10-CM

## 2019-09-14 LAB — POCT INR: INR: 3.9 — AB (ref 2.0–3.0)

## 2019-09-14 MED ORDER — WARFARIN SODIUM 5 MG PO TABS: ORAL_TABLET | ORAL | 2 refills | Status: DC

## 2019-09-14 NOTE — Patient Instructions (Signed)
Take warfarin 1 tablet tonight then decrease dose to 2 tablets daily except 3 tablets on Wednesdays  Recheck in 3 weeks

## 2019-10-01 ENCOUNTER — Other Ambulatory Visit: Payer: BC Managed Care – PPO

## 2019-10-20 ENCOUNTER — Other Ambulatory Visit: Payer: Self-pay

## 2019-10-20 ENCOUNTER — Ambulatory Visit (INDEPENDENT_AMBULATORY_CARE_PROVIDER_SITE_OTHER): Payer: BC Managed Care – PPO

## 2019-10-20 DIAGNOSIS — I359 Nonrheumatic aortic valve disorder, unspecified: Secondary | ICD-10-CM | POA: Diagnosis not present

## 2019-10-20 DIAGNOSIS — I059 Rheumatic mitral valve disease, unspecified: Secondary | ICD-10-CM | POA: Diagnosis not present

## 2019-10-20 DIAGNOSIS — I482 Chronic atrial fibrillation, unspecified: Secondary | ICD-10-CM

## 2019-10-28 ENCOUNTER — Telehealth: Payer: Self-pay | Admitting: *Deleted

## 2019-10-28 NOTE — Telephone Encounter (Signed)
Spoke with pt.  Having boil lanced on his thigh/groin area.  Informed him he does not to take Amoxicillin pre-op for this procedure like dental work.  Informed pt surgeon would given him Abx after procedure if he felt he needed them.  F/U INR appt made for pt.

## 2019-10-28 NOTE — Telephone Encounter (Signed)
Patient called stating that he is having a boil lanced tomorrow. Patient wants to know if he needs to take Amoxicillin before the procedure due to being on coumdin .

## 2019-10-30 ENCOUNTER — Telehealth: Payer: Self-pay | Admitting: Cardiology

## 2019-10-30 ENCOUNTER — Telehealth: Payer: Self-pay | Admitting: *Deleted

## 2019-10-30 NOTE — Telephone Encounter (Signed)
need to know where  surgery is being done. We have not received cardiac clearance.

## 2019-10-30 NOTE — Telephone Encounter (Signed)
Patient with diagnosis of afib and mechanical aortic valve replacement on warfarin for anticoagulation.    Procedure:  boil lanced on his thigh/groin area Date of procedure: TBD  Patient may hold warfarin for 5 days prior to procedure if surgeon desires, but patient WILL need Lovenox bridge.  He will also need an updated BMP and CBC for lovenox bridge.  I will route to preop pool for medical clearance

## 2019-10-30 NOTE — Telephone Encounter (Signed)
New message   Patient states that he is going to have surgery done wanted to know about holding medications. I advised the office that is doing the procedure will need to call in or fax a clearance form to be sent to the preop team.

## 2019-10-30 NOTE — Telephone Encounter (Signed)
Per 6/30 phone note, pt wanted to know if ok to hold coumadin for 1 week as the surgeon recommended before he would do his surgery

## 2019-11-03 ENCOUNTER — Telehealth: Payer: Self-pay | Admitting: Cardiology

## 2019-11-03 NOTE — Telephone Encounter (Signed)
        [facility] Pre-operative Risk Assessment    HEARTCARE STAFF: - Please ensure there is not already an duplicate clearance open for this procedure. - Under Visit Info/Reason for Call, type in Other and utilize the format Clearance MM/DD/YY or Clearance TBD. Do not use dashes or single digits. - If request is for dental extraction, please clarify the # of teeth to be extracted.  Request for surgical clearance:  1. What type of surgery is being performed? Excision of right groin lesion  2. When is this surgery scheduled? TBD  3. What type of clearance is required (medical clearance vs. Pharmacy clearance to hold med vs. Both)? Both  4. Are there any medications that need to be held prior to surgery and how long? 5 days hold coumadin  5. Practice name and name of physician performing surgery? DR. Phillip Heal   6. What is the office phone number? [phone]   7.   What is the office fax number? [phone]  8.   Anesthesia type (None, local, MAC, general) ? Local   Angeline S Hammer 11/03/2019, 5:00 PM  _________________________________________________________________   (provider comments below)

## 2019-11-03 NOTE — Telephone Encounter (Signed)
Patient is on coumadin for mechanical aortic valve. Clinical pharmacist to review.

## 2019-11-04 NOTE — Telephone Encounter (Signed)
Patient with diagnosis of mechanical aortic valve with afib on warfarin for anticoagulation.    Procedure: Excision of right groin lesion Date of procedure: TBD  Per office protocol, patient can hold warfarin for 5 days prior to procedure.   Patient WILL need bridging with Lovenox (enoxaparin) around procedure.  He will need updated scr and CBC before lovenox bridge can be coordinated. I will send to to Vashti Hey in [place] [facility] so she can coordinate.

## 2019-11-04 NOTE — Telephone Encounter (Signed)
Got it!     Thanks =).

## 2019-11-04 NOTE — Telephone Encounter (Signed)
Left message for the patient to call back and speak to the on-call preop APP of the day 

## 2019-11-05 NOTE — Telephone Encounter (Signed)
I reached out to the pt today and asked if he would please have the surgeon's office fax over a clearance request form to [phone]. Pt states he may not have the surgery as the site is improving and getting smaller. I asked the pt to please keep [place] in the loop if he is either to continue or cancel his surgery. I did suggest he still have clearance form in case he does proceed. Pt thanked me for the help.

## 2019-11-05 NOTE — Telephone Encounter (Signed)
Left message for the patient to call back and speak to the on-call preop APP of the day 

## 2019-11-06 ENCOUNTER — Other Ambulatory Visit: Payer: Self-pay

## 2019-11-06 ENCOUNTER — Ambulatory Visit (INDEPENDENT_AMBULATORY_CARE_PROVIDER_SITE_OTHER): Payer: BC Managed Care – PPO | Admitting: *Deleted

## 2019-11-06 DIAGNOSIS — I482 Chronic atrial fibrillation, unspecified: Secondary | ICD-10-CM | POA: Diagnosis not present

## 2019-11-06 DIAGNOSIS — Z5181 Encounter for therapeutic drug level monitoring: Secondary | ICD-10-CM | POA: Diagnosis not present

## 2019-11-06 DIAGNOSIS — Z952 Presence of prosthetic heart valve: Secondary | ICD-10-CM | POA: Diagnosis not present

## 2019-11-06 LAB — POCT INR: INR: 1.5 — AB (ref 2.0–3.0)

## 2019-11-06 MED ORDER — WARFARIN SODIUM 5 MG PO TABS: ORAL_TABLET | ORAL | 2 refills | Status: AC

## 2019-11-06 NOTE — Patient Instructions (Signed)
Take warfarin 3 tablets x 3 days (Fri,Sat,Sun) then resume 2 tablets daily except 3 tablets on Wednesdays  Recheck in 3 weeks Surgical procedure requiring lovenox has been cancelled

## 2019-11-11 NOTE — Telephone Encounter (Signed)
Pt is aware that if he proceeds with surgery that we will need a surgical clearance from the surgeon's office. Will close encounter.

## 2019-11-12 NOTE — Telephone Encounter (Signed)
° °  Primary Cardiologist: Rollene Rotunda, MD  Left a voicemail for patient to call back for ongoing preop assessment.   Beatriz Stallion, PA-C 11/12/2019, 11:12 AM

## 2019-11-13 NOTE — Telephone Encounter (Signed)
   Primary Cardiologist: Rollene Rotunda, MD  Chart reviewed as part of pre-operative protocol coverage. Patient was contacted 11/13/2019 in reference to pre-operative risk assessment for pending surgery as outlined below.  Sean Juarez was last seen on 09/02/19 by Dr. Antoine Poche. H/o ascending aortic dissection and moderate mitral regurgitation status post Bentall with mechanical aortic valve and mitral valve repair. Also h/o atrial flutter, HTN, obesity. Pre-procedure coronary CTA showed single area of calcification in prox LAD, no need for invasive cath.  We have left several messages as below. I reached out to patient today and was able to reach him. He reports the lesion has gone away on its own and that he cancelled his procedure. Therefore, no further action needed on our end except will route to Vashti Hey as [place] because as below, also needs updated CBC and Scr given ongoing anticoagulation. Otherwise will remove from pre-op APP box. I encouraged patient to let us know if he requires the procedure to be rescheduled in the future.  Laurann Montana, PA-C 11/13/2019, 10:45 AM

## 2020-05-04 ENCOUNTER — Telehealth: Payer: Self-pay | Admitting: Cardiology

## 2020-05-04 NOTE — Telephone Encounter (Signed)
Please give pt's son Jovannie Ulibarri a call @ [phone]  Pt is currently incarcerated and is needing a letter stating he can be moved to a smaller pod due to his heart condition. He is currently in a larger pod and is not vaccinated for Covid.

## 2020-05-04 NOTE — Telephone Encounter (Signed)
Routed to MD to advise if he can write this type of letter

## 2020-05-05 NOTE — Telephone Encounter (Signed)
I would not be able to comment on one prison space over another.  I would be able to write a letter that states that he needs to be in a situation in which he gets his medications and has the ability to keep his legs elevated given his lower extremity swelling.

## 2020-05-06 NOTE — Telephone Encounter (Signed)
Relayed to son what Dr. Antoine Poche would be able to state in a letter. He would like this mailed to him/emailed in case it will not arrive in time for his father's hearing  Sean Juarez [address] [place], [place] [address]  Email: [email or web address]

## 2020-05-06 NOTE — Telephone Encounter (Signed)
Attempted to return call to patient's son. Voicemail is full

## 2020-05-06 NOTE — Telephone Encounter (Signed)
Patient's son returning call. 

## 2020-05-09 ENCOUNTER — Encounter: Payer: Self-pay | Admitting: *Deleted

## 2020-05-09 NOTE — Telephone Encounter (Signed)
Spoke with pt son, aware letter is generated and will be sent to his e mail.

## 2020-05-09 NOTE — Telephone Encounter (Signed)
Follow up:    Patient son calling stating that he has not received the letter yet for his father hearing tomorrow. The e-mail was put in wrong the e-mail [email or web address]

## 2020-05-24 ENCOUNTER — Ambulatory Visit: Payer: Self-pay | Admitting: *Deleted

## 2020-11-10 ENCOUNTER — Other Ambulatory Visit: Payer: Self-pay

## 2020-11-10 ENCOUNTER — Ambulatory Visit (INDEPENDENT_AMBULATORY_CARE_PROVIDER_SITE_OTHER): Payer: BC Managed Care – PPO | Admitting: *Deleted

## 2020-11-10 DIAGNOSIS — Z5181 Encounter for therapeutic drug level monitoring: Secondary | ICD-10-CM

## 2020-11-10 DIAGNOSIS — I482 Chronic atrial fibrillation, unspecified: Secondary | ICD-10-CM

## 2020-11-10 DIAGNOSIS — Z952 Presence of prosthetic heart valve: Secondary | ICD-10-CM | POA: Diagnosis not present

## 2020-11-10 LAB — POCT INR: INR: 1.8 — AB (ref 2.0–3.0)

## 2020-11-10 NOTE — Patient Instructions (Signed)
Take warfarin 1 extra tablet today then resume 2 tablets daily except 3 tablets on Wednesdays Recheck in 10 days in [place]

## 2020-11-30 ENCOUNTER — Ambulatory Visit (INDEPENDENT_AMBULATORY_CARE_PROVIDER_SITE_OTHER): Payer: BC Managed Care – PPO | Admitting: *Deleted

## 2020-11-30 DIAGNOSIS — I482 Chronic atrial fibrillation, unspecified: Secondary | ICD-10-CM

## 2020-11-30 DIAGNOSIS — Z5181 Encounter for therapeutic drug level monitoring: Secondary | ICD-10-CM | POA: Diagnosis not present

## 2020-11-30 DIAGNOSIS — Z952 Presence of prosthetic heart valve: Secondary | ICD-10-CM

## 2020-11-30 LAB — POCT INR: INR: 7.5 — AB (ref 2.0–3.0)

## 2020-11-30 NOTE — Patient Instructions (Addendum)
Took warfarin this morning. Hold warfarin x 4 days and come for INR check on Monday 12/05/20 Pt denies any s/s of bleeding.  Bleeding and fall precautions discussed with pt and he verbalized understanding.  Pt to report to ED if he develops bleeding or has fall. Explained need for INR draw today at the lab.  Pt refused.

## 2020-12-05 ENCOUNTER — Ambulatory Visit (INDEPENDENT_AMBULATORY_CARE_PROVIDER_SITE_OTHER): Payer: BC Managed Care – PPO | Admitting: *Deleted

## 2020-12-05 DIAGNOSIS — Z952 Presence of prosthetic heart valve: Secondary | ICD-10-CM | POA: Diagnosis not present

## 2020-12-05 DIAGNOSIS — I482 Chronic atrial fibrillation, unspecified: Secondary | ICD-10-CM | POA: Diagnosis not present

## 2020-12-05 DIAGNOSIS — Z5181 Encounter for therapeutic drug level monitoring: Secondary | ICD-10-CM

## 2020-12-05 LAB — POCT INR: INR: 1.4 — AB (ref 2.0–3.0)

## 2020-12-05 NOTE — Patient Instructions (Signed)
Restart warfarin 2 tablets daily except 3 tablets on Wednesdays Recheck in 1 week

## 2020-12-14 ENCOUNTER — Telehealth: Payer: Self-pay | Admitting: *Deleted

## 2020-12-14 NOTE — Telephone Encounter (Signed)
12/14/20  Missed INR appt on 12/12/20.  LMOM for pt to call and reschedule INR appt.

## 2020-12-26 ENCOUNTER — Other Ambulatory Visit: Payer: Self-pay

## 2020-12-26 ENCOUNTER — Ambulatory Visit (INDEPENDENT_AMBULATORY_CARE_PROVIDER_SITE_OTHER): Payer: BC Managed Care – PPO | Admitting: *Deleted

## 2020-12-26 DIAGNOSIS — Z952 Presence of prosthetic heart valve: Secondary | ICD-10-CM

## 2020-12-26 DIAGNOSIS — I482 Chronic atrial fibrillation, unspecified: Secondary | ICD-10-CM

## 2020-12-26 DIAGNOSIS — Z5181 Encounter for therapeutic drug level monitoring: Secondary | ICD-10-CM | POA: Diagnosis not present

## 2020-12-26 LAB — POCT INR: INR: 8 — AB (ref 2.0–3.0)

## 2020-12-26 NOTE — Patient Instructions (Signed)
Hold warfarin x 3 days.  Come for INR check on Thursday. Explained need to go to lab for STAT PT/INR.  Pt states he can not go. Denies bleeding.  Bleeding and fall precautions discussed with pt and he verbalized understanding.  Knows to go to ED if he develops symptoms.

## 2020-12-29 ENCOUNTER — Ambulatory Visit (INDEPENDENT_AMBULATORY_CARE_PROVIDER_SITE_OTHER): Payer: BC Managed Care – PPO | Admitting: *Deleted

## 2020-12-29 ENCOUNTER — Other Ambulatory Visit: Payer: Self-pay

## 2020-12-29 DIAGNOSIS — Z5181 Encounter for therapeutic drug level monitoring: Secondary | ICD-10-CM

## 2020-12-29 DIAGNOSIS — Z952 Presence of prosthetic heart valve: Secondary | ICD-10-CM

## 2020-12-29 DIAGNOSIS — I482 Chronic atrial fibrillation, unspecified: Secondary | ICD-10-CM | POA: Diagnosis not present

## 2020-12-29 LAB — POCT INR: INR: 3.7 — AB (ref 2.0–3.0)

## 2020-12-29 NOTE — Patient Instructions (Signed)
Decrease warfarin to 2 tablets daily except 1 tablet on Mondays and Thursdays Recheck in 1 week

## 2021-01-23 ENCOUNTER — Ambulatory Visit (INDEPENDENT_AMBULATORY_CARE_PROVIDER_SITE_OTHER): Payer: BC Managed Care – PPO | Admitting: *Deleted

## 2021-01-23 DIAGNOSIS — Z952 Presence of prosthetic heart valve: Secondary | ICD-10-CM

## 2021-01-23 DIAGNOSIS — Z5181 Encounter for therapeutic drug level monitoring: Secondary | ICD-10-CM

## 2021-01-23 DIAGNOSIS — I482 Chronic atrial fibrillation, unspecified: Secondary | ICD-10-CM | POA: Diagnosis not present

## 2021-01-23 LAB — POCT INR: INR: 3.5 — AB (ref 2.0–3.0)

## 2021-01-23 NOTE — Patient Instructions (Signed)
Continue warfarin 2 tablets daily except 1 tablet on Mondays and Thursdays Recheck in 4 weeks 

## 2021-02-14 DIAGNOSIS — M7989 Other specified soft tissue disorders: Secondary | ICD-10-CM | POA: Insufficient documentation

## 2021-02-14 NOTE — Progress Notes (Signed)
Cardiology Office Note   Date:  02/15/2021   ID:  Sean Juarez, DOB 06/17/1970, MRN [id number]  PCP:  Ardyth Man, MD  Cardiologist:   Rollene Rotunda, MD Referring:  Ardyth Man, MD  Chief Complaint  Patient presents with   Edema       History of Present Illness: Sean Juarez is a 50 y.o. male who presents for follow up of ascending aortic dissection and moderate mitral regurgitation status post Bentall with mechanical aortic valve and mitral valve repair.  Since I last saw him he has been in the hospital small bowel obstruction.  [place] and I was able to review this in Care Everywhere.  He lost about 60 pounds as a result of this.  I did look through the records and there were no cardiac issues.  His INR did go up and his warfarin had to be held until he came to a therapeutic range.  He had an echocardiogram with his EF being about 45 to 50%.  There was stable mitral valve..  There were no other acute findings.  He is slowly recovering but still has some abdominal complaints.  Has not been particularly active.  He has been weak.  He feels his heart thumping but he really does not notice any irregularity in his atrial fibrillation.  He has had some chronic lower extremity swelling.  He was hypotensive so he has stopped most of his medications except his Lasix, Coumadin and potassium.  He has been sent home on all of the same meds as previous except for lower dose of beta-blocker.   Past Medical History:  Diagnosis Date   Aortic insufficiency    Atrial flutter (HCC)    CHF (congestive heart failure) (HCC)    Hypertension    Obesity     Past Surgical History:  Procedure Laterality Date   BENTALL PROCEDURE N/A 12/18/2012   Procedure: BENTALL PROCEDURE;  Surgeon: Alleen Borne, MD;  Location: [facility] OR;  Service: Open Heart Surgery;  Laterality: N/A;  Biopsy of Media Stinal Adenopathy   BIOPSY OF MEDIASTINAL MASS N/A 12/18/2012   Procedure: BIOPSY OF MEDIASTINAL  MASS;  Surgeon: Alleen Borne, MD;  Location: [facility] OR;  Service: Open Heart Surgery;  Laterality: N/A;   INTRAOPERATIVE TRANSESOPHAGEAL ECHOCARDIOGRAM N/A 12/18/2012   Procedure: INTRAOPERATIVE TRANSESOPHAGEAL ECHOCARDIOGRAM;  Surgeon: Alleen Borne, MD;  Location: [facility] OR;  Service: Open Heart Surgery;  Laterality: N/A;   MITRAL VALVE REPAIR N/A 12/18/2012   Procedure: MITRAL VALVE REPAIR (MVR);  Surgeon: Alleen Borne, MD;  Location: [facility] OR;  Service: Open Heart Surgery;  Laterality: N/A;   NO PAST SURGERIES     None       Current Outpatient Medications  Medication Sig Dispense Refill   furosemide (LASIX) 40 MG tablet Take 40 mg by mouth daily.      potassium chloride SA (K-DUR,KLOR-CON) 20 MEQ tablet Take 20 mEq by mouth daily.      warfarin (COUMADIN) 5 MG tablet Take 2 tablets daily except 3 tablets on Wednesdays or as directed 70 tablet 2   losartan (COZAAR) 25 MG tablet Take 1 tablet (25 mg total) by mouth daily. 90 tablet 3   No current facility-administered medications for this visit.    Allergies:   Patient has no known allergies.    ROS:  Please see the history of present illness.   Otherwise, review of systems are positive for none.  All other systems are reviewed and negative.    PHYSICAL EXAM: VS:  BP 118/70   Pulse 67   Ht 6\' 3"  (1.905 m)   Wt 214 lb (97.1 kg)   BMI 26.75 kg/m  , BMI Body mass index is 26.75 kg/m. GENERAL:  Well appearing NECK:  No jugular venous distention, waveform within normal limits, carotid upstroke brisk and symmetric, no bruits, no thyromegaly LUNGS:  Clear to auscultation bilaterally CHEST:  Well healed sternotomy scar. HEART:  PMI not displaced or sustained, mechanical S1 and S2 within normal limits, no S3, no clicks, no rubs, no murmurs, irregular ABD:  Flat, positive bowel sounds normal in frequency in pitch, no bruits, no rebound, no guarding, no midline pulsatile mass, no hepatomegaly, no splenomegaly EXT:  2 plus pulses throughout,  mod edema, no cyanosis no clubbing   EKG:  EKG is  ordered today. The ekg ordered today demonstrates atrial fibrillation, rate 67, poor anterior R wave progression, left axis deviation, premature ventricular contraction, no acute T wave changes.   Recent Labs: No results found for requested labs within last 8760 hours.    Lipid Panel No results found for: CHOL, TRIG, HDL, CHOLHDL, VLDL, LDLCALC, LDLDIRECT    Wt Readings from Last 3 Encounters:  02/15/21 214 lb (97.1 kg)  09/02/19 262 lb (118.8 kg)  09/25/17 274 lb (124.3 kg)      Other studies Reviewed: Additional studies/ records that were reviewed today include: Care Everywhere. Review of the above records demonstrates:  Please see elsewhere in the note.     ASSESSMENT AND PLAN:  BENTALL:   This was stable the last time was looked at and he did have an echocardiogram during his recent hospitalization.  No change in therapy.   MV REPAIR:   This was stable on echo in June 2021.  He has stable repair on echo earlier this year.  He understands endocarditis prophylaxis.   HTN:  His BP is low.  I will permanently discontinue the amlodipine and beta-blocker but I would like him for his slightly reduced ejection fraction to be on a low-dose of Cozaar 25 mg.   OBESITY :  We talked about keeping the weight off that he lost with his recent illness.   ATRIAL FIB:      He tolerates this rhythm with rate control and anticoagulation.    EDEMA: He will continue his Lasix and potassium.   Current medicines are reviewed at length with the patient today.  The patient does not have concerns regarding medicines.  The following changes have been made: As above  Labs/ tests ordered today include: None  Orders Placed This Encounter  Procedures   EKG 12-Lead      Disposition:   FU with me 3 months   Signed, July 2021, MD  02/15/2021 12:57 PM    [facility]

## 2021-02-15 ENCOUNTER — Ambulatory Visit (INDEPENDENT_AMBULATORY_CARE_PROVIDER_SITE_OTHER): Payer: BC Managed Care – PPO | Admitting: Cardiology

## 2021-02-15 ENCOUNTER — Encounter: Payer: Self-pay | Admitting: Cardiology

## 2021-02-15 ENCOUNTER — Other Ambulatory Visit: Payer: Self-pay

## 2021-02-15 VITALS — BP 118/70 | HR 67 | Ht 75.0 in | Wt 214.0 lb

## 2021-02-15 DIAGNOSIS — I10 Essential (primary) hypertension: Secondary | ICD-10-CM

## 2021-02-15 DIAGNOSIS — I482 Chronic atrial fibrillation, unspecified: Secondary | ICD-10-CM | POA: Diagnosis not present

## 2021-02-15 DIAGNOSIS — M7989 Other specified soft tissue disorders: Secondary | ICD-10-CM

## 2021-02-15 DIAGNOSIS — Z952 Presence of prosthetic heart valve: Secondary | ICD-10-CM | POA: Diagnosis not present

## 2021-02-15 MED ORDER — LOSARTAN POTASSIUM 25 MG PO TABS: 25.0000 mg | ORAL_TABLET | Freq: Every day | ORAL | 3 refills | Status: AC

## 2021-02-15 MED ORDER — LOSARTAN POTASSIUM 25 MG PO TABS: 25.0000 mg | ORAL_TABLET | Freq: Every day | ORAL | 3 refills | Status: DC

## 2021-02-15 NOTE — Patient Instructions (Signed)
Medication Instructions:  Please discontinue your Metoprolol, Amlodipine and current Cozaar 100 mg. Start Cozaar (Losartan) 25 mg once a day. Continue all other medications as listed.  *If you need a refill on your cardiac medications before your next appointment, please call your pharmacy*  Follow-Up: At [facility], you and your health needs are our priority.  As part of our continuing mission to provide you with exceptional heart care, we have created designated Provider Care Teams.  These Care Teams include your primary Cardiologist (physician) and Advanced Practice Providers (APPs -  Physician Assistants and Nurse Practitioners) who all work together to provide you with the care you need, when you need it.  We recommend signing up for the patient portal called "MyChart".  Sign up information is provided on this After Visit Summary.  MyChart is used to connect with patients for Virtual Visits (Telemedicine).  Patients are able to view lab/test results, encounter notes, upcoming appointments, etc.  Non-urgent messages can be sent to your provider as well.   To learn more about what you can do with MyChart, go to [email or web address].    Your next appointment:   3 month(s)  The format for your next appointment:   In Person  Provider:   Rollene Rotunda, MD   Thank you for choosing [facility]!!

## 2021-02-16 ENCOUNTER — Ambulatory Visit (INDEPENDENT_AMBULATORY_CARE_PROVIDER_SITE_OTHER): Payer: BC Managed Care – PPO | Admitting: *Deleted

## 2021-02-16 DIAGNOSIS — Z5181 Encounter for therapeutic drug level monitoring: Secondary | ICD-10-CM | POA: Diagnosis not present

## 2021-02-16 DIAGNOSIS — I482 Chronic atrial fibrillation, unspecified: Secondary | ICD-10-CM | POA: Diagnosis not present

## 2021-02-16 DIAGNOSIS — Z952 Presence of prosthetic heart valve: Secondary | ICD-10-CM | POA: Diagnosis not present

## 2021-02-16 LAB — POCT INR: INR: 5.9 — AB (ref 2.0–3.0)

## 2021-02-16 NOTE — Patient Instructions (Addendum)
Hold warfarin Friday and Saturday then decrease dose to 2 tablets daily except 1 tablet on Sundays, Tuesdays and Thursdays Pt doesn't know when he will be able to come for recheck because he is scheduled to go back to jail next week for possibly 3 years.  Will call for appointment if he stays out.  Note written to jail nurse if he is incarcerated.

## 2021-02-23 ENCOUNTER — Telehealth: Payer: Self-pay | Admitting: *Deleted

## 2021-02-23 NOTE — Telephone Encounter (Signed)
Pt overdue to have INR checked. Called pt and LMOM for pt to call clinic back and schedule an appointment to have INR checked.

## 2021-04-04 ENCOUNTER — Telehealth: Payer: Self-pay | Admitting: Cardiology

## 2021-04-04 NOTE — Telephone Encounter (Signed)
Pt is currently incarcerated  at the [facility] in [place], he wants to know if Dr. Antoine Poche will sign paperwork stating that pt is disabled. 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   Pt was sentenced to 12 years on 02/21/21.  Pt sent a message to his Significant Other Caroll Rancher asking  her to contact our office. Pt does not have a DPR on file.  Pt is housed at the jail listed below: [facility] [address] [place], [place] [address] Phone # [phone]

## 2021-04-06 NOTE — Telephone Encounter (Signed)
Called [facility], spoke with Johnella Moloney, [employer]. She states pt has an appt with a cardiologist in [place]. They are not allowed to cross states lines with inmates, so they will not be transporting pt to [facility] for the appointment. They requested information on the pt, they were made aware we would needs a release of information form in order to do that. They asked for the fax number and will get the form sent over. She is unsure if the pt will be released before the appointment with Dr. Antoine Poche. Will leave current appointment in place.

## 2021-04-20 ENCOUNTER — Ambulatory Visit: Payer: Self-pay | Admitting: *Deleted

## 2021-05-15 NOTE — Progress Notes (Deleted)
Cardiology Office Note   Date:  05/15/2021   ID:  Sean Juarez, DOB 26-Apr-1971, MRN [id number]  PCP:  Sean Palms, MD  Cardiologist:   Minus Breeding, MD Referring:  Sean Palms, MD  No chief complaint on file.      History of Present Illness: Sean Juarez is a 51 y.o. male who presents for follow up of ascending aortic dissection and moderate mitral regurgitation status post Bentall with mechanical aortic valve and mitral valve repair.  Since I last saw him ***   *** he has been in the hospital small bowel obstruction.  [place] and I was able to review this in [place].  He lost about 60 pounds as a result of this.  I did look through the records and there were no cardiac issues.  His INR did go up and his warfarin had to be held until he came to a therapeutic range.  He had an echocardiogram with his EF being about 45 to 50%.  There was stable mitral valve..  There were no other acute findings.  He is slowly recovering but still has some abdominal complaints.  Has not been particularly active.  He has been weak.  He feels his heart thumping but he really does not notice any irregularity in his atrial fibrillation.  He has had some chronic lower extremity swelling.  He was hypotensive so he has stopped most of his medications except his Lasix, Coumadin and potassium.  He has been sent home on all of the same meds as previous except for lower dose of beta-blocker.   Past Medical History:  Diagnosis Date   Aortic insufficiency    Atrial flutter (HCC)    CHF (congestive heart failure) ([place])    Hypertension    Obesity     Past Surgical History:  Procedure Laterality Date   BENTALL PROCEDURE N/A 12/18/2012   Procedure: BENTALL PROCEDURE;  Surgeon: Gaye Pollack, MD;  Location: [place];  Service: Open Heart Surgery;  Laterality: N/A;  Biopsy of Media Stinal Adenopathy   BIOPSY OF MEDIASTINAL MASS N/A 12/18/2012   Procedure: BIOPSY OF MEDIASTINAL MASS;   Surgeon: Gaye Pollack, MD;  Location: [place];  Service: Open Heart Surgery;  Laterality: N/A;   INTRAOPERATIVE TRANSESOPHAGEAL ECHOCARDIOGRAM N/A 12/18/2012   Procedure: INTRAOPERATIVE TRANSESOPHAGEAL ECHOCARDIOGRAM;  Surgeon: Gaye Pollack, MD;  Location: [facility] OR;  Service: Open Heart Surgery;  Laterality: N/A;   MITRAL VALVE REPAIR N/A 12/18/2012   Procedure: MITRAL VALVE REPAIR (MVR);  Surgeon: Gaye Pollack, MD;  Location: [place];  Service: Open Heart Surgery;  Laterality: N/A;   NO PAST SURGERIES     None       Current Outpatient Medications  Medication Sig Dispense Refill   furosemide (LASIX) 40 MG tablet Take 40 mg by mouth daily.      losartan (COZAAR) 25 MG tablet Take 1 tablet (25 mg total) by mouth daily. 90 tablet 3   potassium chloride SA (K-DUR,KLOR-CON) 20 MEQ tablet Take 20 mEq by mouth daily.      warfarin (COUMADIN) 5 MG tablet Take 2 tablets daily except 3 tablets on Wednesdays or as directed 70 tablet 2   No current facility-administered medications for this visit.    Allergies:   Patient has no known allergies.    ROS:  Please see the history of present illness.   Otherwise, review of systems are positive for ***.  All other systems are reviewed and negative.    PHYSICAL EXAM: VS:  There were no vitals taken for this visit. , BMI There is no height or weight on file to calculate BMI. GENERAL:  Well appearing NECK:  No jugular venous distention, waveform within normal limits, carotid upstroke brisk and symmetric, no bruits, no thyromegaly LUNGS:  Clear to auscultation bilaterally CHEST:  Well healed sternotomy scar. HEART:  PMI not displaced or sustained,S1 and S2  ***within normal limits, no S3, no S4, no clicks, no rubs, *** murmurs ABD:  Flat, positive bowel sounds normal in frequency in pitch, no bruits, no rebound, no guarding, no midline pulsatile mass, no hepatomegaly, no splenomegaly EXT:  2 plus pulses throughout, no edema, no cyanosis no  clubbing    ***GENERAL:  Well appearing NECK:  No jugular venous distention, waveform within normal limits, carotid upstroke brisk and symmetric, no bruits, no thyromegaly LUNGS:  Clear to auscultation bilaterally CHEST:  Well healed sternotomy scar. HEART:  PMI not displaced or sustained, mechanical S1 and S2 within normal limits, no S3, no clicks, no rubs, no murmurs, irregular ABD:  Flat, positive bowel sounds normal in frequency in pitch, no bruits, no rebound, no guarding, no midline pulsatile mass, no hepatomegaly, no splenomegaly EXT:  2 plus pulses throughout, mod edema, no cyanosis no clubbing   EKG:  EKG is *** ordered today. The ekg ordered *** demonstrates atrial fibrillation, rate 67, poor anterior R wave progression, left axis deviation, premature ventricular contraction, no acute T wave changes.   Recent Labs: No results found for requested labs within last 8760 hours.    Lipid Panel No results found for: CHOL, TRIG, HDL, CHOLHDL, VLDL, LDLCALC, LDLDIRECT    Wt Readings from Last 3 Encounters:  02/15/21 214 lb (97.1 kg)  09/02/19 262 lb (118.8 kg)  09/25/17 274 lb (124.3 kg)      Other studies Reviewed: Additional studies/ records that were reviewed today include: Care Everywhere. Review of the above records demonstrates:  Please see elsewhere in the note.     ASSESSMENT AND PLAN:  BENTALL:    ***This was stable the last time was looked at and he did have an echocardiogram during his recent hospitalization.  No change in therapy.   MV REPAIR:   This was stable on echo in June 2021.  *** He has stable repair on echo earlier this year.  He understands endocarditis prophylaxis.   HTN:  His BP is low ***.  I will permanently discontinue the amlodipine and beta-blocker but I would like him for his slightly reduced ejection fraction to be on a low-dose of Cozaar 25 mg.   OBESITY :   ***   talked about keeping the weight off that he lost with his recent illness.    ATRIAL FIB:     ***  He tolerates this rhythm with rate control and anticoagulation.    EDEMA:  ***  He will continue his Lasix and potassium.   Current medicines are reviewed at length with the patient today.  The patient does not have concerns regarding medicines.  The following changes have been made: As above  Labs/ tests ordered today include: None  No orders of the defined types were placed in this encounter.     Disposition:   FU with me 3 months   Signed, Minus Breeding, MD  05/15/2021 8:10 PM    [facility]

## 2021-05-17 ENCOUNTER — Ambulatory Visit: Payer: BC Managed Care – PPO | Admitting: Cardiology

## 2021-05-17 DIAGNOSIS — M7989 Other specified soft tissue disorders: Secondary | ICD-10-CM

## 2021-05-17 DIAGNOSIS — I482 Chronic atrial fibrillation, unspecified: Secondary | ICD-10-CM

## 2021-05-17 DIAGNOSIS — I10 Essential (primary) hypertension: Secondary | ICD-10-CM

## 2021-05-17 DIAGNOSIS — Z9889 Other specified postprocedural states: Secondary | ICD-10-CM

## 2021-05-17 DIAGNOSIS — Z952 Presence of prosthetic heart valve: Secondary | ICD-10-CM
